# Patient Record
Sex: Female | Born: 2000 | Hispanic: Yes | Marital: Single | Smoking: Never smoker
Health system: Western US, Academic
[De-identification: ages and names within clinical notes are randomized; demographics above are authoritative.]

## PROBLEM LIST (undated history)

## (undated) DIAGNOSIS — E669 Obesity, unspecified: Secondary | ICD-10-CM

## (undated) DIAGNOSIS — E119 Type 2 diabetes mellitus without complications: Secondary | ICD-10-CM

## (undated) DIAGNOSIS — L709 Acne, unspecified: Secondary | ICD-10-CM

## (undated) DIAGNOSIS — L732 Hidradenitis suppurativa: Secondary | ICD-10-CM

## (undated) HISTORY — DX: Acne, unspecified: L70.9

## (undated) HISTORY — PX: MOUTH SURGERY: SHX715

## (undated) HISTORY — DX: Obesity, unspecified: E66.9

## (undated) HISTORY — PX: INCISION AND DRAINAGE, ABSCESS, AXILLA: SHX003694

---
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
## 2021-01-19 ENCOUNTER — Other Ambulatory Visit (INDEPENDENT_AMBULATORY_CARE_PROVIDER_SITE_OTHER): Payer: Self-pay | Admitting: Pediatric Endocrinology

## 2021-01-19 DIAGNOSIS — E669 Obesity, unspecified: Secondary | ICD-10-CM

## 2021-02-18 ENCOUNTER — Telehealth (INDEPENDENT_AMBULATORY_CARE_PROVIDER_SITE_OTHER): Payer: Self-pay | Admitting: "Endocrinology

## 2021-02-18 NOTE — Telephone Encounter (Signed)
Holly [name] called. She will finish her last Dexcom G6 sensor tonight. She called [facility], but they don't have any and don't expect any soon. Khiley was told that the company is not making them for some reason.  Since she does not have any test strips, I told her I will call in a prescription for 200 Accu-Chek Guide test strips. I will also ask our nurses to look into this issue more.  I called [facility] at [phone]. The pharmacist on duty confirmed that they do not have any G6 sensors on the shelf. He does not know when more may come in. I gave him a verbal order for 200 test strips with 5 refills.  Molli Knock, MD, CDE

## 2021-02-19 ENCOUNTER — Encounter (INDEPENDENT_AMBULATORY_CARE_PROVIDER_SITE_OTHER): Payer: Self-pay | Admitting: Pediatric Endocrinology

## 2021-02-19 ENCOUNTER — Telehealth (INDEPENDENT_AMBULATORY_CARE_PROVIDER_SITE_OTHER): Payer: Self-pay

## 2021-02-19 NOTE — Telephone Encounter (Signed)
PA for [facility] Receiver has been submitted on covermymeds. Waiting on approval/denial from insurance.

## 2021-02-24 NOTE — Telephone Encounter (Signed)
PA approved until 02/19/2022 

## 2021-03-05 ENCOUNTER — Other Ambulatory Visit: Payer: Self-pay

## 2021-03-05 ENCOUNTER — Ambulatory Visit (INDEPENDENT_AMBULATORY_CARE_PROVIDER_SITE_OTHER): Payer: Medicaid Other | Admitting: Pediatric Endocrinology

## 2021-03-05 ENCOUNTER — Encounter (INDEPENDENT_AMBULATORY_CARE_PROVIDER_SITE_OTHER): Payer: Self-pay | Admitting: Pediatric Endocrinology

## 2021-03-05 VITALS — BP 120/70 | HR 84 | Wt 172.4 lb

## 2021-03-05 DIAGNOSIS — E119 Type 2 diabetes mellitus without complications: Secondary | ICD-10-CM | POA: Diagnosis not present

## 2021-03-05 DIAGNOSIS — Z794 Long term (current) use of insulin: Secondary | ICD-10-CM

## 2021-03-05 DIAGNOSIS — L732 Hidradenitis suppurativa: Secondary | ICD-10-CM

## 2021-03-05 LAB — POCT GLYCOSYLATED HEMOGLOBIN (HGB A1C): Hemoglobin A1C: 8.1 % — AB (ref 4.0–5.6)

## 2021-03-05 LAB — POCT GLUCOSE (DEVICE FOR HOME USE): POC Glucose: 276 mg/dl — AB (ref 70–99)

## 2021-03-05 MED ORDER — MOUNJARO 2.5 MG/0.5ML ~~LOC~~ SOAJ: 2.5000 mg | SUBCUTANEOUS | 2 refills | Status: DC

## 2021-03-05 NOTE — Patient Instructions (Signed)
  Increase Tresiba to 44 units Decrease sweet tea intake (and other sweet drinks)  If you see that your sugars are starting to be <110- decrease your [place]. Call or MyChart if questions. Do not use MyChart on the weekends because we are not able to access the inbox outside of business hours.   We will try to get [facility] approved for you. I think that this would be optimal. If we are unable to get [facility] approved we may need to reintroduce Novolog.   Increasing exercise and decreasing simple carb intake (like sweet tea) may help [place] avoid Novolog.

## 2021-03-05 NOTE — Progress Notes (Signed)
Subjective:  Subjective  Patient Name: Holly [name] Date of Birth: 04/12/2001  MRN: [id number]  Holly [name]  presents to the office today for follow up evaluation and management of her  diabetes  HISTORY OF PRESENT ILLNESS:   Holly [name] is a 20 y.o. Hispanic female   Holly [name] was unaccompanied  1. Holly [name] was seen in pediatric clinic in June 2018 for her 16 year WCC. At that visit they obtained labs which revealed a hemoglobin a1c of 12%. Her PCP called her to go to the ER for evaluation but family was in [place] for vacation. She went to [facility] and had her initial evaluation there. She was antibody negative for GAD, Islet Cell and Insulin antibodies.  She was started on Lantus with sliding scale only Novolog. She was instructed to limit carbs to 60 grams per meal.  She transferred care to our clinic in July 2018 after returning from [place].   2. Holly [name] was last seen in pediatric endocrine clinic on 12/03/20 . In the interim she has been generally healthy.   She had Covid in early August. She was taking regular cough syrup (Nyquil) which was raising her sugars. She did not contact the office to let us know. She got over her Covid but her sensor was still running really high. When she calibrated her sensor there was a difference of 70 points from her meter.   She says that she has been consistent with taking her Trulicty and her [place]. However, she has been forgetting her Metformin. She has been eating out more often. When she eats out she will drink a sweet tea or a water.   She is taking   Tresiba 40 units Trulicity 4.5 mg. Dose on Friday.   She is missing her 750 mg of Metformin per day.   She does not feel that she is more hungry.   She has her cosmetology license now. 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     3. Pertinent Review of Systems:  Constitutional: The patient feels "good". The patient seems healthy and active.  Eyes:  Vision seems to be good. There are no recognized eye problems.  Neck: The patient has no complaints of anterior neck swelling, soreness, tenderness, pressure, discomfort, or difficulty swallowing.   Heart: Heart rate increases with exercise or other physical activity. The patient has no complaints of palpitations, irregular heart beats, chest pain, or chest pressure.   Lungs: no asthma or wheezing Gastrointestinal: Bowel movents seem normal. The patient has no complaints of excessive hunger, acid reflux, upset stomach, stomach aches or pains, diarrhea, or constipation.  Legs: Muscle mass and strength seem normal. There are no complaints of numbness, tingling, burning, or pain. No edema is noted.  Feet: There are no obvious foot problems. There are no complaints of numbness, tingling, burning, or pain. No edema is noted. Neurologic: There are no recognized problems with muscle movement and strength, sensation, or coordination. GYN/GU:  LMP 9/7 Skin: mild acne. Stretch marks. Still having issues with hydradenitis Suppurativa- no currently active sores  Diabetes ID: None- does have it on her phone   Injection sites: stomach.   Annual Labs - Done March 2022    Dexcom CGM:                 PAST MEDICAL, FAMILY, AND SOCIAL HISTORY  Past Medical History:  Diagnosis Date   Diabetes mellitus without complication (HCC)    Mild acne 12/04/2013   Obesity, unspecified 12/04/2013  Family History  Problem Relation Age of Onset   Diabetes Mother    Mom and maternal uncles with type 2 diabetes.   Current Outpatient Medications:    ACCU-CHEK FASTCLIX LANCETS MISC, Check sugar 6 x daily, Disp: 204 each, Rfl: 5   BD PEN NEEDLE NANO 2ND GEN 32G X 4 MM MISC, USE TO INJECT INSULIN 6 TIMES DAILY, Disp: 200 each, Rfl: 5   Clindamycin-Benzoyl Per, Refr, gel, Apply 1 application topically 2 (two) times daily., Disp: 45 g, Rfl: 1   Continuous Blood Gluc Sensor (DEXCOM G6 SENSOR) MISC, 1 each by Does  not apply route as directed. 1 sensor every 10 days, Disp: 3 each, Rfl: 11   Continuous Blood Gluc Transmit (DEXCOM G6 TRANSMITTER) MISC, 1 each by Does not apply route every 3 (three) months., Disp: 1 each, Rfl: 3   Dulaglutide (TRULICITY) 4.5 MG/0.5ML SOPN, Inject 4.5 mg as directed once a week., Disp: 2 mL, Rfl: 6   glucose blood (ACCU-CHEK GUIDE) test strip, USE TO CHECK SUGAR LEVELS SIX TIMES DAILY, Disp: 200 strip, Rfl: 5   insulin degludec (TRESIBA FLEXTOUCH) 200 UNIT/ML FlexTouch Pen, Up to 120 units per day as directed by physician, Disp: 30 mL, Rfl: 11   metFORMIN (GLUCOPHAGE-XR) 750 MG 24 hr tablet, Take 1 tablet (750 mg total) by mouth in the morning and at bedtime., Disp: 60 tablet, Rfl: 5   Ascorbic Acid (VITAMIN C ADULT GUMMIES PO), Take by mouth. (Patient not taking: No sig reported), Disp: , Rfl:    Continuous Blood Gluc Receiver (DEXCOM G6 RECEIVER) DEVI, 1 each by Does not apply route as directed. (Patient not taking: No sig reported), Disp: 1 each, Rfl: 1   dimenhyDRINATE (DRAMAMINE) 50 MG tablet, Take by mouth every 8 (eight) hours as needed., Disp: , Rfl:    Glucagon (BAQSIMI TWO PACK) 3 MG/DOSE POWD, Place 1 each into the nose as needed (severe hypoglycmia with unresponsiveness). (Patient not taking: No sig reported), Disp: 1 each, Rfl: 3   hydrOXYzine (ATARAX/VISTARIL) 10 MG tablet, Take 1 tablet (10 mg total) by mouth 3 (three) times daily as needed. (Patient not taking: No sig reported), Disp: 60 tablet, Rfl: 2   meclizine (ANTIVERT) 25 MG tablet, Take 1 tablet (25 mg total) by mouth 3 (three) times daily as needed for dizziness. (Patient not taking: No sig reported), Disp: 30 tablet, Rfl: 0   NOVOLOG FLEXPEN 100 UNIT/ML FlexPen, INJECT UP TO 100 UNITS UNDER THE SKIN DAILY PER DIABETES CARE PLAN (Patient not taking: No sig reported), Disp: 30 mL, Rfl: 5   ondansetron (ZOFRAN-ODT) 8 MG disintegrating tablet, Take 1 tablet (8 mg total) by mouth every 8 (eight) hours as needed for  nausea or vomiting. (Patient not taking: No sig reported), Disp: 20 tablet, Rfl: 0  Allergies as of 03/05/2021   (No Known Allergies)     reports that she has never smoked. She has never used smokeless tobacco. She reports that she does not drink alcohol and does not use drugs. Pediatric History  Patient Parents   Cervantes,Vitalina (Mother)   Cabrera,Arturo (Father)   Other Topics Concern   Not on file  Social History Narrative   Graduated from [employer]. Currently working at [employer].     1. School and Family:  Lives with parents and sister. Eye lashes and nails. Working from house 2. Activities: She is not currently exercising outside of work. [employer] quality scientist store). Also working at [employer].  3. Primary Care Provider: Luna Fuse,  Aron Baba, MD  ROS: There are no other significant problems involving Kateleen's other body systems.    Objective:  Objective   Vital Signs:    BP 120/70   Pulse 84   Wt 172 lb 6 oz (78.2 kg)   BMI 30.53 kg/m   Blood pressure percentiles are not available for patients who are 18 years or older.  Ht Readings from Last 3 Encounters:  05/29/20 5\' 3"  (1.6 m) (31 %, Z= -0.49)*  04/08/20 5\' 2"  (1.575 m) (19 %, Z= -0.88)*  01/24/20 5' 2.28" (1.582 m) (22 %, Z= -0.77)*   * Growth percentiles are based on CDC (Girls, 2-20 Years) data.   Wt Readings from Last 3 Encounters:  03/05/21 172 lb 6 oz (78.2 kg) (93 %, Z= 1.46)*  12/03/20 174 lb 9.6 oz (79.2 kg) (94 %, Z= 1.52)*  09/05/20 177 lb 9.6 oz (80.6 kg) (94 %, Z= 1.60)*   * Growth percentiles are based on CDC (Girls, 2-20 Years) data.   HC Readings from Last 3 Encounters:  No data found for [facility]   Body surface area is 1.86 meters squared.  No height on file for this encounter. 93 %ile (Z= 1.46) based on CDC (Girls, 2-20 Years) weight-for-age data using vitals from 03/05/2021. Constitutional: The patient appears healthy and well nourished. The patient's height and weight are normal for  age. She has lost 2 pounds since last visit  Head: The head is normocephalic. Face: The face appears normal. There are no obvious dysmorphic features. Eyes: The eyes appear to be normally formed and spaced. Gaze is conjugate. There is no obvious arcus or proptosis. Moisture appears normal. Ears: The ears are normally placed and appear externally normal. Mouth: The oropharynx and tongue appear normal. Dentition appears to be normal for age. Oral moisture is normal. Neck: The neck appears to be visibly normal. The consistency of the thyroid gland is normal. The thyroid gland is not tender to palpation. Lungs: No increased work of breathing. CTA.  Heart: Normal pulses and peripheral perfusion. RRR S1S2 Abdomen: The abdomen appears to be normal in size for the patient's age. . There is no obvious hepatomegaly, splenomegaly, or other mass effect.  Arms: Muscle size and bulk are normal for age. Hands: There is no obvious tremor. Phalangeal and metacarpophalangeal joints are normal. Palmar muscles are normal for age. Palmar skin is normal. Palmar moisture is also normal. Legs: Muscles appear normal for age. No edema is present. Feet: Feet are normally formed. Dorsalis pedal pulses are normal. Neurologic: Strength is normal for age in both the upper and lower extremities. Muscle tone is normal. Sensation to touch is normal in both the legs and feet.   GYN/GU: normal female  Skin: moderate facial acne. No acanthosis noted. Has green discoloration around neck from necklace  LAB DATA:    Lab Results  Component Value Date   HGBA1C 8.1 (A) 03/05/2021   HGBA1C 7.9 (A) 12/03/2020   HGBA1C 8.8 (H) 09/02/2020   HGBA1C 8.2 (A) 05/29/2020   HGBA1C 8.5 (A) 01/24/2020   HGBA1C 8.3 (A) 10/18/2019   HGBA1C 8.6 (A) 07/10/2019   HGBA1C 7.6 (A) 04/09/2019     Results for orders placed or performed in visit on 03/05/21  POCT glycosylated hemoglobin (Hb A1C)  Result Value Ref Range   Hemoglobin A1C 8.1 (A)  4.0 - 5.6 %   HbA1c POC (<> result, manual entry)     HbA1c, POC (prediabetic range)     HbA1c, POC (  controlled diabetic range)    POCT Glucose (Device for Home Use)  Result Value Ref Range   Glucose Fasting, POC     POC Glucose 276 (A) 70 - 99 mg/dl       Assessment and Plan:  Assessment  ASSESSMENT: Holly [name] is a 20 y.o. 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 [facility] and was antibody negative based on records.   Diabetes    - POC CBG as above - A1C as above- Still above ADA goal of <6.5% - Still taking [place]iba but no longer on Novolog - Tresiba 40 units.  - Now taking Trulicity 4.5 mg  - Continue blood sugar monitoring  - Dexcom CGM   Hydradenitis Suppurativa - no current lesions - Using Duac ointment PRN with good results.   PLAN:     1. Diagnostic: BG and A1C as above.  2. Therapeutic: Medications as above. Increase Tresiba to 44 units. Will try to get Mounjara 2.5 mg approved.   3. Patient education:  All discussion as above.  4. Follow-up: No follow-ups on file.      Dessa PhiJennifer Denajah Farias, MD  Level of Service: >40 minutes spent today reviewing the medical chart, counseling the patient/family, and documenting today's encounter.  When a patient is on insulin, intensive monitoring of blood glucose levels is necessary to avoid hyperglycemia and hypoglycemia. Severe hyperglycemia/hypoglycemia can lead to hospital admissions and be life threatening.    Patient referred by Ettefagh, Aron BabaKate Scott, MD for IDDM   Copy of this note sent to Ettefagh, Aron BabaKate Scott, MD

## 2021-03-06 ENCOUNTER — Telehealth (INDEPENDENT_AMBULATORY_CARE_PROVIDER_SITE_OTHER): Payer: Self-pay | Admitting: Pharmacist

## 2021-03-06 NOTE — Telephone Encounter (Signed)
Submitted Mounjaro prior authorization on 03/06/21.     Thank you for involving clinical pharmacist/diabetes educator to assist in providing this patient's care.   Zachery Conch, PharmD, BCACP, CDCES, CPP

## 2021-03-06 NOTE — Telephone Encounter (Addendum)
Checked covermymeds on 03/06/21  Mounjaro PA approved from 03/06/21 - 03/06/22    Called patient on 03/06/2021 at 11:53 AM and left HIPAA-compliant VM with instructions to call [facility] back.  Plan to provide update and schedule f/u to assist with titrating Mounjaro dose.   Thank you for involving pharmacy/diabetes educator to assist in providing this patient's care.   Zachery Conch, PharmD, BCACP, CDCES, CPP

## 2021-03-09 NOTE — Telephone Encounter (Signed)
Contacted patient   She received my VM, but unfortunately has not been able to obtain the medication from the pharmacy.   Will contact the pharmacy to determine what is going on and then send [place] an update via MyChart. It is possible pharmacy may need NDC code to order product [facility] [phone]).  Scheduled sugar call for 03/17/21 9:30 am.  Thank you for involving clinical pharmacist/diabetes educator to assist in providing this patient's care.   Zachery Conch, PharmD, BCACP, CDCES, CPP

## 2021-03-16 NOTE — Progress Notes (Signed)
This is a Pediatric Specialist virtual follow up consult provided via telephone. Holly [name] consented to an telephone visit consult today.  Location of patient: Holly [name] is at home. Location of provider: Zachery Conch, PharmD, BCACP, CDCES, CPP is at office.   I connected with Holly [name] on 03/17/21 by telephone and verified that I am speaking with the correct person using two identifiers. She has not had any upset stomach since starting Mounjaro. She feels her BG readings have been higher since starting Mounjaro. She also feels she is getting s/sx of a yeast infection due to her BG readings being elevated.   DM Medications  Biguanide: Metformin XR 750 mg once daily  Basal insulin: Tresiba 40 units once daily  GLP-1 agonist: Mounajro 2.5 mg subQ once weekly (first dose 03/13/21; Fridays)  Dexcom G6 CGM Report    Assessment TIR is not at goal > 70%. No hypoglycemia. TIR has improved since starting Mounjaro last Friday however BG have increased from 47% very high --> 60% very high. Will plan to add Novolog 6 units to her largest meal (dinner at 9:30 pm) and will increase metformin XR 750 mg once daily. Patient also would like to try a faster titration with Mounjaro 2.5 mg subQ once weekly --> 5 mg subQ once weekly after 2 weeks rather than 4 weeks considering she was previously on Trulicity 4.5 mg subQ once weekly. Will plan to do so. Since pt is experiencing s/sx of yeast infection will send in prescription for fluconazole 150 mg x 1 dose. Continue wearing Dexcom G6 CGM. Follow up 04/03/2021 (patient knows to contact me sooner if she starts experiencing hypoglycemia)  Plan Increase metformin XR 750 mg once daily --> twice daily  Continue Tresiba 40 units once daily Inititate Novolog 6 units at dinner  Increase Mounjaro 2.5 mg subQ once weekly (first dose 03/13/21; Fridays) --> Mounjaro 5 mg subQ once weekly  Initiate fluconazole 150 mg x 1 dose.   Continue Dexcom G6 CGM Follow up: 04/03/2021 (patient knows to contact me sooner if she starts experiencing hypoglycemia)  This appointment required 25 minutes of patient care (this includes precharting, chart review, review of results, virtual care, etc.).  Time spent since 02/26/21 - 03/27/21: 25 minutes  -03/17/21: 25 minutes   Thank you for involving clinical pharmacist/diabetes educator to assist in providing this patient's care.   Zachery Conch, PharmD, BCACP, CDCES, CPP

## 2021-03-17 ENCOUNTER — Ambulatory Visit (INDEPENDENT_AMBULATORY_CARE_PROVIDER_SITE_OTHER): Payer: Medicaid Other | Admitting: Pharmacist

## 2021-03-17 ENCOUNTER — Other Ambulatory Visit: Payer: Self-pay

## 2021-03-17 DIAGNOSIS — Z794 Long term (current) use of insulin: Secondary | ICD-10-CM

## 2021-03-17 DIAGNOSIS — E119 Type 2 diabetes mellitus without complications: Secondary | ICD-10-CM

## 2021-03-17 MED ORDER — MOUNJARO 5 MG/0.5ML ~~LOC~~ SOAJ: 5.0000 mg | SUBCUTANEOUS | 2 refills | Status: DC

## 2021-03-17 MED ORDER — METFORMIN HCL ER 750 MG PO TB24: 750.0000 mg | ORAL_TABLET | Freq: Two times a day (BID) | ORAL | 5 refills | Status: DC

## 2021-03-17 MED ORDER — FLUCONAZOLE 150 MG PO TABS: 150.0000 mg | ORAL_TABLET | Freq: Every day | ORAL | 0 refills | Status: DC

## 2021-04-03 ENCOUNTER — Ambulatory Visit (INDEPENDENT_AMBULATORY_CARE_PROVIDER_SITE_OTHER): Payer: Medicaid Other | Admitting: Pharmacist

## 2021-04-07 ENCOUNTER — Other Ambulatory Visit: Payer: Self-pay

## 2021-04-07 ENCOUNTER — Ambulatory Visit (INDEPENDENT_AMBULATORY_CARE_PROVIDER_SITE_OTHER): Payer: Medicaid Other | Admitting: Pharmacist

## 2021-04-07 DIAGNOSIS — E119 Type 2 diabetes mellitus without complications: Secondary | ICD-10-CM

## 2021-04-07 DIAGNOSIS — Z794 Long term (current) use of insulin: Secondary | ICD-10-CM

## 2021-04-07 NOTE — Progress Notes (Signed)
This is a Pediatric Specialist virtual follow up consult provided via telephone. Holly [name] consented to an telephone visit consult today.  Location of patient: Holly [name] is at home. Location of provider: Zachery Conch, PharmD, BCACP, CDCES, CPP is at office.   I connected with Holly [name] on 04/07/21 by telephone and verified that I am speaking with the correct person using two identifiers. [name] reports she has not increased to 5 mg subQ once weekly yet - still on 2.5 mg subQ once weekly. She reports that she feels nauseous and "gassy". She wants to continue Mounjaro 2.5 mg for x 2 weeks then increase to 5 mg. She reports forgetting to do second dose of metformin. She reports adherence [place]. She takes Novolog 6 units about 3x/week. Reports yeast infection improved after fluconazole dose, however, she still has itching (she will take Azo (phenazopyridine)).  DM Medications  Biguanide: Metformin XR 750 mg once daily  Basal insulin: Tresiba 40 units once daily  Bolus insulin: Novolog 6 units at dinner GLP-1 agonist: Mounjaro 2.5 mg subQ once weekly (first dose 03/13/21; Fridays)  Dexcom G6 CGM Report    Assessment Diabetes Management - TIR is not at goal > 70%. No hypoglycemia. TIR has improved slightly. Patient was experiencing GI upset with Mounjaro 2.5 mg so did not increase to 5 mg. Discussed with her to eat smaller portions and explained MOA leads to GI side effects. Offered slower titration schedule to patient (wait an additional month prior to increasing dose from 2.5 mg --> 5 mg); patient would prefer to titrate Mounjaro dose in 2 weeks. She will keep in consideration to eat smaller meals. She is lacking adherence to BID dosing of metformin; will plan to just take metformin once daily. She is taking Novolog ~3x/week; advise her to take daily at dinner. Continue Tresiba 40 units daily Continue wearing Dexcom G6 CGM. Follow up 04/28/2021 (patient  knows to contact me sooner if she starts experiencing hypoglycemia)  Vaginal irritation/itching - Reports yeast infection improved after fluconazole 150 mg x 1 dose, however, she still has itching. She told me she will take Azo (phenazopyridine)). Advised her to f/u with PCP if s/sx worsens.    Plan Decrease metformin XR 750 mg twice daily --> once daily Continue Tresiba 40 units once daily Continue Novolog 6 units at dinner (increase frequency from 3x/week --> daily) Increase Mounjaro 2.5 mg subQ once weekly (first dose 03/13/21; Fridays) --> Mounjaro 5 mg subQ once weekly on 04/24/21 Continue Dexcom G6 CGM Follow up: 04/28/2021 (patient knows to contact me sooner if she starts experiencing hypoglycemia)  This appointment required 24 minutes of patient care (this includes precharting, chart review, review of results, virtual care, etc.).  Time spent since 03/28/21 - 04/27/21: 24 minutes  -04/07/21: 24 minutes   Thank you for involving clinical pharmacist/diabetes educator to assist in providing this patient's care.   Zachery Conch, PharmD, BCACP, CDCES, CPP

## 2021-04-13 ENCOUNTER — Encounter (INDEPENDENT_AMBULATORY_CARE_PROVIDER_SITE_OTHER): Payer: Self-pay

## 2021-04-15 ENCOUNTER — Encounter (INDEPENDENT_AMBULATORY_CARE_PROVIDER_SITE_OTHER): Payer: Self-pay

## 2021-04-27 NOTE — Progress Notes (Deleted)
This is a Pediatric Specialist virtual follow up consult provided via telephone. Holly [name] consented to an telephone visit consult today.  Location of patient: Holly [name] is at home. Location of provider: Zachery Conch, PharmD, BCACP, CDCES, CPP is at office.   I connected with Holly [name] on 04/28/21 by telephone and verified that I am speaking with the correct person using two identifiers. ***  DM Medications  Biguanide: Metformin XR 750 mg once daily  Basal insulin: Tresiba 40 units once daily  Bolus insulin: Novolog 6 units at dinner - TAKING EVERY NIGHT GLP-1 agonist: Mounjaro 2.5 mg subQ once weekly (first dose 03/13/21; Fridays) *** ON 5 MG??????  Dexcom G6 CGM Report  Assessment Diabetes Management - TIR is not at goal > 70%. No hypoglycemia. *** Follow up ***  Plan *** metformin XR 750 mg once daily *** Tresiba 40 units once daily *** Novolog 6 units at dinner *** Mounjaro 5 mg subQ once weekly on 04/24/21 *** Dexcom G6 CGM Follow up: ***  This appointment required *** minutes of patient care (this includes precharting, chart review, review of results, virtual care, etc.).  Time spent since 04/28/21 - 04/26/21: *** minutes  -04/28/21: *** minutes  Thank you for involving clinical pharmacist/diabetes educator to assist in providing this patient's care.   Zachery Conch, PharmD, BCACP, CDCES, CPP

## 2021-04-28 ENCOUNTER — Ambulatory Visit (INDEPENDENT_AMBULATORY_CARE_PROVIDER_SITE_OTHER): Payer: Medicaid Other | Admitting: Pharmacist

## 2021-05-27 ENCOUNTER — Other Ambulatory Visit (INDEPENDENT_AMBULATORY_CARE_PROVIDER_SITE_OTHER): Payer: Self-pay | Admitting: Pediatric Endocrinology

## 2021-05-27 ENCOUNTER — Encounter (INDEPENDENT_AMBULATORY_CARE_PROVIDER_SITE_OTHER): Payer: Self-pay | Admitting: Pharmacist

## 2021-05-27 DIAGNOSIS — Z794 Long term (current) use of insulin: Secondary | ICD-10-CM

## 2021-05-27 DIAGNOSIS — E119 Type 2 diabetes mellitus without complications: Secondary | ICD-10-CM

## 2021-05-27 MED ORDER — FLUCONAZOLE 150 MG PO TABS: 150.0000 mg | ORAL_TABLET | Freq: Every day | ORAL | 0 refills | Status: DC

## 2021-06-04 ENCOUNTER — Ambulatory Visit (INDEPENDENT_AMBULATORY_CARE_PROVIDER_SITE_OTHER): Payer: Medicaid Other | Admitting: Pediatric Endocrinology

## 2021-06-04 ENCOUNTER — Other Ambulatory Visit: Payer: Self-pay

## 2021-06-04 ENCOUNTER — Encounter (INDEPENDENT_AMBULATORY_CARE_PROVIDER_SITE_OTHER): Payer: Self-pay | Admitting: Pharmacist

## 2021-06-04 ENCOUNTER — Encounter (INDEPENDENT_AMBULATORY_CARE_PROVIDER_SITE_OTHER): Payer: Self-pay | Admitting: Pediatric Endocrinology

## 2021-06-04 VITALS — BP 110/76 | HR 80 | Wt 164.6 lb

## 2021-06-04 DIAGNOSIS — Z794 Long term (current) use of insulin: Secondary | ICD-10-CM

## 2021-06-04 DIAGNOSIS — E1165 Type 2 diabetes mellitus with hyperglycemia: Secondary | ICD-10-CM

## 2021-06-04 DIAGNOSIS — E119 Type 2 diabetes mellitus without complications: Secondary | ICD-10-CM

## 2021-06-04 LAB — POCT GLUCOSE (DEVICE FOR HOME USE): Glucose Fasting, POC: 154 mg/dL — AB (ref 70–99)

## 2021-06-04 LAB — POCT GLYCOSYLATED HEMOGLOBIN (HGB A1C): Hemoglobin A1C: 8.5 % — AB (ref 4.0–5.6)

## 2021-06-04 MED ORDER — FLUCONAZOLE 150 MG PO TABS: 150.0000 mg | ORAL_TABLET | ORAL | 0 refills | Status: DC | PRN

## 2021-06-04 MED ORDER — MOUNJARO 7.5 MG/0.5ML ~~LOC~~ SOAJ: 7.5000 mg | SUBCUTANEOUS | 5 refills | Status: DC

## 2021-06-04 NOTE — Progress Notes (Signed)
Subjective:  Subjective  Patient Name: Holly [name] Date of Birth: 2000/12/13  MRN: [id number]  Holly [name]  presents to the office today for follow up evaluation and management of her  diabetes  HISTORY OF PRESENT ILLNESS:   Holly [name] is a 20 y.o. Hispanic female   Holly [name] was unaccompanied  1. Holly [name] was seen in pediatric clinic in June 2018 for her 16 year [place]. At that visit they obtained labs which revealed a hemoglobin a1c of 12%. Her PCP called her to go to the ER for evaluation but family was in [place] for vacation. She went to [facility] and had her initial evaluation there. She was antibody negative for GAD, Islet Cell and Insulin antibodies.  She was started on Lantus with sliding scale only Novolog. She was instructed to limit carbs to 60 grams per meal.  She transferred care to our clinic in July 2018 after returning from [place].   2. Holly [name] was last seen in pediatric endocrine clinic on 03/05/21 . In the interim she has been generally healthy.   She has been feeling more tired. She thinks that it may be related to starting Mounjaro instead of Ozempic. She has been sleeping more than usual.   She is going to bed at 11-12 pm. She gets up between 8-noon depending on her work schedule. She sometimes naps after work.   She also had Covid in August which would correspond with about the time she started on [facility].  She also had Flu at Thanksgiving.   Overall she has not had any issues with the [facility] except that she has not seen as much improvement in her sugars as she was hoping. She has not had any abdominal side effects.   She thinks that she is eating ok. However, her mom thinks she is too skinny and her sister asked her if she is eating.   She has not had breakfast yet today.  Dinner: She had a cheeseburger and 10 nuggets from [employer] for dinner last night. She drank water Snack: Dried mango with chili powder-  snack Lunch: sandwich from [employer] Breakfast: donut.    She is taking   Tresiba 44 units Mounjaro Dose on Sundays   She has her cosmetology license now. 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 She is also working at [employer] (walking around the store! She does online shopping)   3. Pertinent Review of Systems:  Constitutional: The patient feels "good". The patient seems healthy and active.  Eyes: Vision seems to be good. There are no recognized eye problems.  Neck: The patient has no complaints of anterior neck swelling, soreness, tenderness, pressure, discomfort, or difficulty swallowing.   Heart: Heart rate increases with exercise or other physical activity. The patient has no complaints of palpitations, irregular heart beats, chest pain, or chest pressure.   Lungs: no asthma or wheezing Gastrointestinal: Bowel movents seem normal. The patient has no complaints of excessive hunger, acid reflux, upset stomach, stomach aches or pains, diarrhea, or constipation.  Legs: Muscle mass and strength seem normal. There are no complaints of numbness, tingling, burning, or pain. No edema is noted.  Feet: There are no obvious foot problems. There are no complaints of numbness, tingling, burning, or pain. No edema is noted. Neurologic: There are no recognized problems with muscle movement and strength, sensation, or coordination. GYN/GU:  LMP 11/27  Had a yeast infection  Skin: mild acne. Stretch marks.   Diabetes ID: None- does have it on her  phone   Injection sites: stomach.   Annual Labs - Done March 2022    Dexcom CGM:                       PAST MEDICAL, FAMILY, AND SOCIAL HISTORY  Past Medical History:  Diagnosis Date   Diabetes mellitus without complication ([place])    Mild acne 12/04/2013   Obesity, unspecified 12/04/2013    Family History  Problem Relation Age of Onset   Diabetes Mother    Mom and maternal uncles with type 2 diabetes.   Current Outpatient  Medications:    ACCU-CHEK FASTCLIX LANCETS MISC, Check sugar 6 x daily, Disp: 204 each, Rfl: 5   BD PEN NEEDLE NANO 2ND GEN 32G X 4 MM MISC, USE TO INJECT INSULIN 6 TIMES DAILY, Disp: 200 each, Rfl: 5   Clindamycin-Benzoyl Per, Refr, gel, Apply 1 application topically 2 (two) times daily., Disp: 45 g, Rfl: 1   Continuous Blood Gluc Receiver ([place]) DEVI, 1 each by Does not apply route as directed., Disp: 1 each, Rfl: 1   Continuous Blood Gluc Sensor (DEXCOM G6 SENSOR) MISC, 1 each by Does not apply route as directed. 1 sensor every 10 days, Disp: 3 each, Rfl: 11   Continuous Blood Gluc Transmit (DEXCOM G6 TRANSMITTER) MISC, 1 each by Does not apply route every 3 (three) months., Disp: 1 each, Rfl: 3   glucose blood (ACCU-CHEK GUIDE) test strip, USE TO CHECK SUGAR LEVELS SIX TIMES DAILY, Disp: 200 strip, Rfl: 5   insulin degludec (TRESIBA FLEXTOUCH) 200 UNIT/ML FlexTouch Pen, Up to 120 units per day as directed by physician, Disp: 30 mL, Rfl: 11   metFORMIN (GLUCOPHAGE-XR) 750 MG 24 hr tablet, Take 1 tablet (750 mg total) by mouth in the morning and at bedtime., Disp: 60 tablet, Rfl: 5   tirzepatide (MOUNJARO) 7.5 MG/0.5ML Pen, Inject 7.5 mg into the skin once a week., Disp: 2 mL, Rfl: 5   Ascorbic Acid (VITAMIN C ADULT GUMMIES PO), Take by mouth. (Patient not taking: Reported on 12/03/2020), Disp: , Rfl:    dimenhyDRINATE (DRAMAMINE) 50 MG tablet, Take by mouth every 8 (eight) hours as needed. (Patient not taking: Reported on 06/04/2021), Disp: , Rfl:    fluconazole (DIFLUCAN) 150 MG tablet, Take 1 tablet (150 mg total) by mouth as needed., Disp: 5 tablet, Rfl: 0   Glucagon (BAQSIMI TWO PACK) 3 MG/DOSE POWD, Place 1 each into the nose as needed (severe hypoglycmia with unresponsiveness). (Patient not taking: Reported on 01/24/2020), Disp: 1 each, Rfl: 3   hydrOXYzine (ATARAX/VISTARIL) 10 MG tablet, Take 1 tablet (10 mg total) by mouth 3 (three) times daily as needed. (Patient not taking:  Reported on 09/05/2020), Disp: 60 tablet, Rfl: 2   meclizine (ANTIVERT) 25 MG tablet, Take 1 tablet (25 mg total) by mouth 3 (three) times daily as needed for dizziness. (Patient not taking: Reported on 05/29/2020), Disp: 30 tablet, Rfl: 0   NOVOLOG FLEXPEN 100 UNIT/ML FlexPen, INJECT UP TO 100 UNITS UNDER THE SKIN DAILY PER DIABETES CARE PLAN (Patient not taking: Reported on 12/03/2020), Disp: 30 mL, Rfl: 5   ondansetron (ZOFRAN-ODT) 8 MG disintegrating tablet, Take 1 tablet (8 mg total) by mouth every 8 (eight) hours as needed for nausea or vomiting. (Patient not taking: Reported on 12/03/2020), Disp: 20 tablet, Rfl: 0  Allergies as of 06/04/2021   (No Known Allergies)     reports that she has never smoked. She has never used  smokeless tobacco. She reports that she does not drink alcohol and does not use drugs. Pediatric History  Patient Parents   Cervantes,Vitalina (Mother)   Cabrera,Arturo (Father)   Other Topics Concern   Not on file  Social History Narrative   Graduated from [employer]. Currently working at [employer].     1. School and Family:  Lives with parents and sister. Eye lashes and nails. Working from house 2. Activities: She is not currently exercising outside of work. [employer] store).  3. Primary Care Provider: Carmie End, MD  ROS: There are no other significant problems involving Holly [name]'s other body systems.    Objective:  Objective   Vital Signs:    BP 110/76   Pulse 80   Wt 164 lb 9.6 oz (74.7 kg)   BMI 29.16 kg/m   Blood pressure percentiles are not available for patients who are 18 years or older.  Ht Readings from Last 3 Encounters:  05/29/20 5\' 3"  (1.6 m) (31 %, Z= -0.49)*  04/08/20 5\' 2"  (1.575 m) (19 %, Z= -0.88)*  01/24/20 5' 2.28" (1.582 m) (22 %, Z= -0.77)*   * Growth percentiles are based on CDC (Girls, 2-20 Years) data.   Wt Readings from Last 3 Encounters:  06/04/21 164 lb 9.6 oz (74.7 kg) (90 %, Z= 1.27)*  03/05/21 172 lb  6 oz (78.2 kg) (93 %, Z= 1.46)*  12/03/20 174 lb 9.6 oz (79.2 kg) (94 %, Z= 1.52)*   * Growth percentiles are based on CDC (Girls, 2-20 Years) data.   HC Readings from Last 3 Encounters:  No data found for [facility]   Body surface area is 1.82 meters squared.  No height on file for this encounter. 90 %ile (Z= 1.27) based on CDC (Girls, 2-20 Years) weight-for-age data using vitals from 06/04/2021. Constitutional: The patient appears healthy and well nourished. The patient's height and weight are normal for age. She has lost 8 pounds since last visit  Head: The head is normocephalic. Face: The face appears normal. There are no obvious dysmorphic features. Eyes: The eyes appear to be normally formed and spaced. Gaze is conjugate. There is no obvious arcus or proptosis. Moisture appears normal. Ears: The ears are normally placed and appear externally normal. Mouth: The oropharynx and tongue appear normal. Dentition appears to be normal for age. Oral moisture is normal. Neck: The neck appears to be visibly normal. The consistency of the thyroid gland is normal. The thyroid gland is not tender to palpation. Lungs: No increased work of breathing. CTA.  Heart: Normal pulses and peripheral perfusion. RRR S1S2 Abdomen: The abdomen appears to be normal in size for the patient's age. . There is no obvious hepatomegaly, splenomegaly, or other mass effect.  Arms: Muscle size and bulk are normal for age. Hands: There is no obvious tremor. Phalangeal and metacarpophalangeal joints are normal. Palmar muscles are normal for age. Palmar skin is normal. Palmar moisture is also normal. Legs: Muscles appear normal for age. No edema is present. Feet: Feet are normally formed. Dorsalis pedal pulses are normal. Neurologic: Strength is normal for age in both the upper and lower extremities. Muscle tone is normal. Sensation to touch is normal in both the legs and feet.   GYN/GU: normal female  Skin: moderate facial acne. No  acanthosis noted. Has green discoloration around neck from necklace  LAB DATA:    Lab Results  Component Value Date   HGBA1C 8.5 (A) 06/04/2021   HGBA1C 8.1 (A) 03/05/2021  HGBA1C 7.9 (A) 12/03/2020   HGBA1C 8.8 (H) 09/02/2020   HGBA1C 8.2 (A) 05/29/2020   HGBA1C 8.5 (A) 01/24/2020   HGBA1C 8.3 (A) 10/18/2019   HGBA1C 8.6 (A) 07/10/2019     Results for orders placed or performed in visit on 06/04/21  POCT glycosylated hemoglobin (Hb A1C)  Result Value Ref Range   Hemoglobin A1C 8.5 (A) 4.0 - 5.6 %   HbA1c POC (<> result, manual entry)     HbA1c, POC (prediabetic range)     HbA1c, POC (controlled diabetic range)    POCT Glucose (Device for Home Use)  Result Value Ref Range   Glucose Fasting, POC 154 (A) 70 - 99 mg/dL   POC Glucose         Assessment and Plan:  Assessment  ASSESSMENT: Holly [name] is a 20 y.o. 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 She was diagnosed at [facility] and was antibody negative based on records.    Diabetes    - POC CBG as above - A1C as above- Still above ADA goal of <6.5% - Still taking [place] but no longer on Novolog - Tresiba 44 units.  - Now taking Mounjaro 5 mg- will increase to 7.5 mg this week - Dexcom CGM Reviewed report in detail with patient and used this data to adjust Mounjaro dose.  - She is pleased with weight loss on Mounjaro with lack of abdominal pain and no sense of deprivation in what she is eating.   PLAN:     1. Diagnostic: Lab Orders         POCT glycosylated hemoglobin (Hb A1C)         POCT Glucose (Device for Home Use)     2. Therapeutic: Medications as above. Continue Tresiba 44 units. Increase Mounjaro to 7.5 mg.  3. Patient education:  All discussion as above.  4. Follow-up: Return in about 3 months (around 09/02/2021).    Sugar call with Dr. Ladona Ridgel in 1 month   Dessa Phi, MD  Level of Service: >40 minutes spent today reviewing the medical chart, counseling the patient/family,  and documenting today's encounter.   When a patient is on insulin, intensive monitoring of blood glucose levels is necessary to avoid hyperglycemia and hypoglycemia. Severe hyperglycemia/hypoglycemia can lead to hospital admissions and be life threatening.    Patient referred by Ettefagh, Aron Baba, MD for IDDM   Copy of this note sent to Ettefagh, Aron Baba, MD

## 2021-07-02 ENCOUNTER — Other Ambulatory Visit: Payer: Self-pay

## 2021-07-02 ENCOUNTER — Ambulatory Visit (INDEPENDENT_AMBULATORY_CARE_PROVIDER_SITE_OTHER): Payer: Medicaid Other | Admitting: Pharmacist

## 2021-07-02 DIAGNOSIS — E1165 Type 2 diabetes mellitus with hyperglycemia: Secondary | ICD-10-CM

## 2021-07-02 NOTE — Progress Notes (Signed)
This is a Pediatric Specialist virtual follow up consult provided via telephone. Holly [name] consented to an telephone visit consult today.  Location of patient: Holly [name]  is at home. Location of provider: Trixie Rude, PharmD is at office.   I connected with Lance Sell on 07/02/21 by telephone and verified that I am speaking with the correct person using two identifiers. She reports feeling her sugars have been ok recently. She has not been using her Dexcom recently due to administration site issues, but is willing to try wearing it more consistently after further discussion. She reports checking her sugars manually x3-4 times day. Recent highest of ~275 and lowest of ~150. She does not take Novolog regularly, just during recent holidays when not eating as she should. Reports tolerating increased dose of Mounjaro from 5 mg to 7.5 mg weekly.  DM medications Basal insulin: Tresiba 40 units once daily  GLP-1 agonist: Mounjaro 7.5 mg subQ once weekly (last increased 06/04/21; Sundays)  Dexcom G6 CGM Report   Assessment TIR is not at goal > 70%. No episodes of hypoglycemia. Finger-stick BG elevated 150-270s. Patient encouraged to wear Dexcom G6 CGM more consistently. Encouraged patient to remember to take [place] daily and plan to increase dose to 44 units as patient discussed with Dr. Vanessa  on 12/08. Plan to increase Mounjaro to 10 mg once weekly after patient completes 4 weeks of current dose. Follow-up in two weeks after starting new Mounjaro dose (07/21/21).  Plan Increase Tresiba to 44 units once daily Continue Mounjaro 7.5 mg subQ x1 dose, then increase to 10 mg SubQ once weekly starting 1/14 (takes doses Sundays) Continue Dexcom G6 CGM  Follow up: 07/21/21    This appointment required 25 minutes of patient care (this includes precharting, chart review, review of results, virtual care, etc.).  Time spent since 06/28/21 - 07/28/21: 25 minutes   -07/02/21: 25 minutes (billed no charge)  Trixie Rude, PharmD PGY2 Pediatric Pharmacy Resident  The pharmacy resident and I have discussed this patient's care and are in agreeance with the plan. I have reviewed the documentation as well. I was immediately available to the pharmacy resident for questions and collaboration.  Thank you for involving clinical pharmacist/diabetes educator to assist in providing this patient's care.   Zachery Conch, PharmD, BCACP, CDCES, CPP

## 2021-07-21 ENCOUNTER — Other Ambulatory Visit: Payer: Self-pay

## 2021-07-21 ENCOUNTER — Other Ambulatory Visit (INDEPENDENT_AMBULATORY_CARE_PROVIDER_SITE_OTHER): Payer: Self-pay | Admitting: Pharmacist

## 2021-07-21 ENCOUNTER — Ambulatory Visit (INDEPENDENT_AMBULATORY_CARE_PROVIDER_SITE_OTHER): Payer: Medicaid Other | Admitting: Pharmacist

## 2021-07-21 DIAGNOSIS — E1165 Type 2 diabetes mellitus with hyperglycemia: Secondary | ICD-10-CM

## 2021-07-21 MED ORDER — MOUNJARO 10 MG/0.5ML ~~LOC~~ SOAJ: 10.0000 mg | SUBCUTANEOUS | 2 refills | Status: DC

## 2021-07-21 NOTE — Progress Notes (Addendum)
This is a Pediatric Specialist virtual follow up consult provided via telephone. Holly [name] consented to an telephone visit consult today.  Location of patient: Holly [name]  is at home. Location of provider: Trixie Rude, PharmD is at office.   I connected with Holly [name] on 07/21/21 by telephone and verified that I am speaking with the correct person using two identifiers. She reports things have been going well with her sugars since our last call. She has been wearing her Dexcom more often without issue and is remembering to take her [place] daily. She has been tolerating her Mounjaro and is still taking 7.5 mg at this time. It appears a prescription for the 10 mg pens were not sent to pharmacy at last visit as intended. Dr. Ladona Ridgel discussed this with patient and new prescription for 10 mg pens has been sent for the next refill period.   DM medications Basal insulin: Tresiba 44 units once daily  GLP-1 agonist: Mounjaro 7.5 mg subQ once weekly (last increased 06/04/21; Mondays)  Dexcom G6 CGM Report   Assessment TIR is not at goal > 70%. No episodes of hypoglycemia. Patient encouraged to continue wearing Dexcom G6 CGM consistently. Continue taking Tresiba 44 units daily. Plan to increase Mounjaro to 10 mg once weekly after patient finishes remaining 3 doses of current 7.5 mg supply. Follow-up in three weeks (08/11/21) before starting new Mounjaro dose.  Plan Increase Tresiba to 44 units once daily Continue Mounjaro 7.5 mg subQ x3 more doses, then increase to 10 mg SubQ once weekly starting 2/20 (takes doses Monday) if still tolerating Continue Dexcom G6 CGM  Follow up: 08/11/21    This appointment required 15 minutes of patient care (this includes precharting, chart review, review of results, virtual care, etc.).  Time spent since 06/28/21 - 07/28/21: 40 minutes  -07/02/21: 25 minutes (billed no charge) -07/21/21: 15 minutes (billed no charge)  Trixie Rude, PharmD PGY2 Pediatric Pharmacy Resident  The pharmacy resident and I have discussed this patient's care and are in agreeance with the plan. I have reviewed the documentation as well. I was immediately available to the pharmacy resident for questions and collaboration.  Thank you for involving clinical pharmacist/diabetes educator to assist in providing this patient's care.   Zachery Conch, PharmD, BCACP, CDCES, CPP  I have reviewed the following documentation and I am in agreement with the plan. I was immediately available to the clinical pharmacist for questions and collaboration.  Dessa Phi, MD

## 2021-07-22 ENCOUNTER — Telehealth (INDEPENDENT_AMBULATORY_CARE_PROVIDER_SITE_OTHER): Payer: Self-pay

## 2021-07-22 NOTE — Telephone Encounter (Signed)
Fax received by [facility] stating that [facility] 10mg  is on back order at the moment.  Pt just picked up their 7.5mg s on Jul 19, 2021. In Dr Jul 21, 2021 notes from pt visit on 07/21/21, pt is not expected to start Mounjaro 10mg s:   "Plan to increase Mounjaro to 10 mg once weekly after patient finishes remaining 3 doses of current 7.5 mg supply. Follow-up in three weeks (08/11/21) before starting new Mounjaro dose."  I will inform family they need to call [facility] 5 days prior to needing the new Mounjaro to see if it is in stock.

## 2021-08-11 ENCOUNTER — Ambulatory Visit (INDEPENDENT_AMBULATORY_CARE_PROVIDER_SITE_OTHER): Payer: Medicaid Other | Admitting: Pharmacist

## 2021-08-11 ENCOUNTER — Other Ambulatory Visit: Payer: Self-pay

## 2021-08-11 DIAGNOSIS — E119 Type 2 diabetes mellitus without complications: Secondary | ICD-10-CM

## 2021-08-11 DIAGNOSIS — Z794 Long term (current) use of insulin: Secondary | ICD-10-CM

## 2021-08-11 NOTE — Progress Notes (Addendum)
This is a Pediatric Specialist virtual follow up consult provided via telephone. Holly [name] consented to an telephone visit consult today.  Location of patient: Holly [name]  is at home. Location of provider: Zachery Conch, PharmD, BCACP, CDCES, CPP is at office.   I connected with Holly [name] on 08/11/21 by telephone and verified that I am speaking with the correct person using two identifiers. Patient reports doing well on Mounjaro 7.5 mg subQ once weekly. She does report she has been constipated but thinks this may be related to not drinking enough water. She reports she self-increased Tresiba from 44 units daily to 46 units daily. She is ready to increase to Mounjaro 10 mg subQ once weekly.  DM medications Basal insulin: Tresiba 44 units once daily  GLP-1 agonist: Mounjaro 7.5 mg subQ once weekly (last increased 06/04/21; Mondays)  Dexcom G6 CGM Report   Assessment TIR is not at goal > 70%. No episodes of hypoglycemia. Will increase Mounjaro from 7.5 mg subQ once weekly to 10 mg subQ once weekly. Continue Tresiba 46 units daily. Discussed with her we have to stop increasing [place] as once she gets >0.5 units/kg/day there is a need to add bolus insulin as solely basal insulin is not efficacious for BG lowering. Patient did not like taking bolus insulin previously and forgot to take it often so we will focus on increasing Mounjaro for now. We may need to add SGLT2 inhibitor,  but considering her prior hx of yeast infections I am hesitant to do so until BG readings are lower. Provided her names of possible new medications - Farxiga, Jardiance to look up so we can make collaborative decision to start SGTL2 inhibitor. Follow-up in two weeks (08/25/21) to check in and see how she is doing on increased dose of Mounjaro 10 mg subQ once weekly and re-assess BG readings. Continue wearing Dexcom G6 CGM.   Plan Continue Tresiba 46  units once daily Increase Mounjaro  from 7.5 mg subQ once weekly to 10 mg subQ once weekly Continue Dexcom G6 CGM  Follow up: 08/25/21  This appointment required 8 minutes of patient care (this includes precharting, chart review, review of results, virtual care, etc.).  Time spent since 07/29/21 - 08/25/21: 8 minutes   Thank you for involving clinical pharmacist/diabetes educator to assist in providing this patient's care.   Zachery Conch, PharmD, BCACP, CDCES, CPP    I have reviewed the following documentation and I am in agreement with the plan. I was immediately available to the clinical pharmacist for questions and collaboration.  Dessa Phi, MD

## 2021-08-19 ENCOUNTER — Encounter (INDEPENDENT_AMBULATORY_CARE_PROVIDER_SITE_OTHER): Payer: Self-pay | Admitting: Pharmacist

## 2021-08-25 ENCOUNTER — Ambulatory Visit (INDEPENDENT_AMBULATORY_CARE_PROVIDER_SITE_OTHER): Payer: Medicaid Other | Admitting: Pharmacist

## 2021-08-25 DIAGNOSIS — Z794 Long term (current) use of insulin: Secondary | ICD-10-CM

## 2021-08-25 DIAGNOSIS — E119 Type 2 diabetes mellitus without complications: Secondary | ICD-10-CM

## 2021-08-25 NOTE — Progress Notes (Addendum)
This is a Pediatric Specialist virtual follow up consult provided via telephone. Holly [name] consented to an telephone visit consult today.  Location of patient: Holly [name]  is at home. Location of provider: Zachery Conch, PharmD, BCACP, CDCES, CPP is at office.   I connected with Holly [name] on 08/25/21 by telephone and verified that I am speaking with the correct person using two identifiers. She picked up Mounjaro 10 mg past Thursday. She reports feeling very "gassy". She denies eating fried/fast food/eating large portions. She does not feel she has much of an appetitte.    DM medications Basal insulin: Tresiba 46 units once daily  GLP-1 agonist: Mounjaro 10 mg subQ once weekly (last increased 08/20/21; Thursdays)  Dexcom G6 CGM Report   Assessment TIR is not at goal > 70%. No episodes of hypoglycemia. Patient was able to successfully obtain Mounjaro from the pharmacy and started last week. Patient having some GI effects - is managing appropriately (not eating fried/fast food, eating small portions). She states it is tolerable and she wants to stay on the Mounjaro 10 mg. I explained her tolerance to feeling increases in flatulence will improve the longer she is on the drug.If we need to we can always decrease down to Mounjaro 7.5 mg and increase insulin. We can also do a slower titration with Mounjaro (anticipated next dose increase is 09/17/21). Will increase Tresiba from 46 units once daily to 52 units once daily (0.69 units/kg; >0.5 units/kg but patient is likely going through puberty so may require TDD doses as high as 2.0 units/kg/day which would be 1.0 units/kg/day for 50% basal, 50% bolus; discussed overbasalization risk with pateient). Continue wearing Dexcom G6 CGM. Follow up 09/15/21.  Plan Increase Tresiba 46  units once daily --> Tresiba 52 units daily  Continue Mounjaro 10 mg subQ once weekly Continue Dexcom G6 CGM  Follow up: 09/15/21  This  appointment required 9 minutes of patient care (this includes precharting, chart review, review of results, virtual care, etc.).  Time spent since 07/29/21 - 08/25/21: 9 minutes  -08/11/21: 8 minutes (billed no charge) -08/25/21: 17  minutes (billed no charge)  Thank you for involving clinical pharmacist/diabetes educator to assist in providing this patient's care.   Zachery Conch, PharmD, BCACP, CDCES, CPP   I have reviewed the following documentation and I am in agreement with the plan. I was immediately available to the clinical pharmacist for questions and collaboration.  Dessa Phi, MD

## 2021-08-26 ENCOUNTER — Other Ambulatory Visit: Payer: Self-pay

## 2021-09-07 ENCOUNTER — Ambulatory Visit (INDEPENDENT_AMBULATORY_CARE_PROVIDER_SITE_OTHER): Payer: Medicaid Other | Admitting: Pediatric Endocrinology

## 2021-09-14 ENCOUNTER — Other Ambulatory Visit (INDEPENDENT_AMBULATORY_CARE_PROVIDER_SITE_OTHER): Payer: Self-pay | Admitting: Pediatric Endocrinology

## 2021-09-15 ENCOUNTER — Ambulatory Visit (INDEPENDENT_AMBULATORY_CARE_PROVIDER_SITE_OTHER): Payer: Medicaid Other | Admitting: Pharmacist

## 2021-09-15 NOTE — Progress Notes (Deleted)
This is a Pediatric Specialist virtual follow up consult provided via telephone. ?Holly [name] consented to an telephone visit consult today.  ?Location of patient: Holly [name]  is at home. ?Location of provider: Zachery Conch, PharmD, BCACP, CDCES, CPP is at office.  ? ?I connected with Holly [name] on 09/15/21 by telephone and verified that I am speaking with the correct person using two identifiers. *** ? ? ?DM medications ?Basal insulin: Tresiba 200 units/mL 52 units once daily  ?GLP-1 agonist: Mounjaro 10 mg subQ once weekly (last increased 08/20/21; Thursdays) ? ?Dexcom G6 CGM Report ? ? ?Assessment ?TIR is not at goal > 70%; but has improved from 11% (08/25/21) --> 28% (09/15/21). No episodes of hypoglycemia. ***  Will increase Tresiba from 46 units once daily to 52 units once daily (0.69 units/kg; >0.5 units/kg but patient is likely going through puberty so may require TDD doses as high as 2.0 units/kg/day which would be 1.0 units/kg/day for 50% basal, 50% bolus; discussed overbasalization risk with pateient). Continue wearing Dexcom G6 CGM. Follow up *** ? ?Plan ?*** Tresiba 200 units/mL 52 units daily  ?*** Mounjaro 10 mg subQ once weekly ?Continue Dexcom G6 CGM  ?Follow up: *** ? ?This appointment required *** minutes of patient care (this includes precharting, chart review, review of results, virtual care, etc.). ? ?Time spent since 08/26/21 - 09/25/21: *** minutes  ?-09/15/21: *** minutes (billed no charge) ? ?Thank you for involving clinical pharmacist/diabetes educator to assist in providing this patient's care.  ? ?Zachery Conch, PharmD, BCACP, CDCES, CPP ? ? ? ? ?

## 2021-09-16 ENCOUNTER — Ambulatory Visit (INDEPENDENT_AMBULATORY_CARE_PROVIDER_SITE_OTHER): Payer: Medicaid Other | Admitting: Pediatric Endocrinology

## 2021-09-22 ENCOUNTER — Telehealth (INDEPENDENT_AMBULATORY_CARE_PROVIDER_SITE_OTHER): Payer: Self-pay | Admitting: Pharmacist

## 2021-09-22 ENCOUNTER — Ambulatory Visit (INDEPENDENT_AMBULATORY_CARE_PROVIDER_SITE_OTHER): Payer: Medicaid Other | Admitting: Pharmacist

## 2021-09-22 NOTE — Telephone Encounter (Signed)
Called patient on 09/22/2021 at 4:34 PM and left HIPAA-compliant VM with instructions to call [facility] back. Attempted to contact patient twice. Phone went straight to voicemail each time. ? ?Plan to discuss diabetes management. If patient returns call please advise her to contact me via mychart her availability to reschedule sugar call. ? ?Thank you for involving pharmacy/diabetes educator to assist in providing this patient's care.  ? ?Zachery Conch, PharmD, BCACP, CDCES, CPP ? ? ? ?

## 2021-09-22 NOTE — Progress Notes (Deleted)
This is a Pediatric Specialist virtual follow up consult provided via telephone. ?Holly [name] consented to an telephone visit consult today.  ?Location of patient: Holly [name]  is at home. ?Location of provider: Zachery Conch, PharmD, BCACP, CDCES, CPP is at office.  ? ?I connected with Holly [name] on 09/22/21 by telephone and verified that I am speaking with the correct person using two identifiers. *** ? ? ?DM medications ?Basal insulin: Tresiba 200 units/mL 52 units once daily  ?GLP-1 agonist: Mounjaro 10 mg subQ once weekly (last increased 08/20/21; Thursdays) ? ?Dexcom G6 CGM Report ? ? ? ?Assessment ?TIR is not at goal > 70%; but has improved from 11% (08/25/21) --> 28% (09/15/21). No episodes of hypoglycemia. ***  Will increase Tresiba from 46 units once daily to 52 units once daily (0.69 units/kg; >0.5 units/kg but patient is likely going through puberty so may require TDD doses as high as 2.0 units/kg/day which would be 1.0 units/kg/day for 50% basal, 50% bolus; discussed overbasalization risk with pateient). Continue wearing Dexcom G6 CGM. Follow up *** ? ?Plan ?*** Tresiba 200 units/mL 52 units daily  ?*** Mounjaro 10 mg subQ once weekly ?Continue Dexcom G6 CGM  ?Follow up: *** ? ?This appointment required *** minutes of patient care (this includes precharting, chart review, review of results, virtual care, etc.). ? ?Time spent since 08/26/21 - 09/25/21: *** minutes  ?-09/22/21: *** minutes (billed no charge) ? ?Thank you for involving clinical pharmacist/diabetes educator to assist in providing this patient's care.  ? ?Zachery Conch, PharmD, BCACP, CDCES, CPP ? ? ? ? ?

## 2021-09-29 ENCOUNTER — Ambulatory Visit (INDEPENDENT_AMBULATORY_CARE_PROVIDER_SITE_OTHER): Payer: Medicaid Other | Admitting: Pediatric Endocrinology

## 2021-09-29 ENCOUNTER — Encounter (INDEPENDENT_AMBULATORY_CARE_PROVIDER_SITE_OTHER): Payer: Self-pay | Admitting: Pediatric Endocrinology

## 2021-09-29 VITALS — BP 122/80 | HR 92 | Ht 62.99 in | Wt 162.2 lb

## 2021-09-29 DIAGNOSIS — Z794 Long term (current) use of insulin: Secondary | ICD-10-CM | POA: Diagnosis not present

## 2021-09-29 DIAGNOSIS — E119 Type 2 diabetes mellitus without complications: Secondary | ICD-10-CM

## 2021-09-29 DIAGNOSIS — E1165 Type 2 diabetes mellitus with hyperglycemia: Secondary | ICD-10-CM

## 2021-09-29 LAB — POCT GLUCOSE (DEVICE FOR HOME USE): Glucose Fasting, POC: 178 mg/dL — AB (ref 70–99)

## 2021-09-29 MED ORDER — MOUNJARO 12.5 MG/0.5ML ~~LOC~~ SOAJ
12.5000 mg | SUBCUTANEOUS | 5 refills | Status: DC
Start: 2021-09-29 — End: 2021-11-11

## 2021-09-29 NOTE — Patient Instructions (Signed)
Increase Mounjaro to 12.5 mg ? ?Decrease concentrated sweets like the pineapple juice. Try a pro-biotic or acidophilus supplement instead.  ?

## 2021-09-29 NOTE — Progress Notes (Signed)
Subjective:  ?Subjective  ?Patient Name: Holly SellYesenia [name] Date of Birth: 05-04-2001  MRN: [id number] ? ?Holly [name] [name]  presents to the office today for follow up evaluation and management of her  diabetes ? ?HISTORY OF PRESENT ILLNESS:  ? ?Holly [name] is a 21 y.o. Hispanic female  ? ?Holly [name] was unaccompanied ? ?1. Holly [name] was seen in pediatric clinic in June 2018 for her 16 year WCC. At that visit they obtained labs which revealed a hemoglobin a1c of 12%. Her PCP called her to go to the ER for evaluation but family was in [place] for vacation. [facility] and had her initial evaluation there. She was antibody negative for GAD, Islet Cell and Insulin antibodies.  She was started on Lantus with sliding scale only Novolog. She was instructed to limit carbs to 60 grams per meal.  She transferred care to our clinic in July 2018 after returning from [place].  ? ?2. Holly [name] was last seen in pediatric endocrine clinic on 06/04/21 . In the interim she has been generally healthy.  ? ?She quit her job at [employer] and is now working full time at [employer].  ? ?She has continued on [facility] and feels that she is doing "pretty good" with it now. She is on 10 mg.  ? ?She is not as tired anymore.  ? ?She feels that her glycemic control has been better lately. She is also taking 52 units of [place]iba daily. She has not missed any.  ? ?She admits that she is eating oreo milkshakes at night after work, pineapple juice. ? ?Tresiba 52 units ?Mounjaro Dose 10mg  on Thursdays ? ?3. Pertinent Review of Systems:  ?Constitutional: The patient feels "good". The patient seems healthy and active.  ?Eyes: Vision seems to be good. There are no recognized eye problems.  ?Neck: The patient has no complaints of anterior neck swelling, soreness, tenderness, pressure, discomfort, or difficulty swallowing.   ?Heart: Heart rate increases with exercise or other physical activity. The patient has no  complaints of palpitations, irregular heart beats, chest pain, or chest pressure.   ?Lungs: no asthma or wheezing ?Gastrointestinal: Bowel movents seem normal. The patient has no complaints of excessive hunger, acid reflux, upset stomach, stomach aches or pains, diarrhea, or constipation.  ?Legs: Muscle mass and strength seem normal. There are no complaints of numbness, tingling, burning, or pain. No edema is noted.  ?Feet: There are no obvious foot problems. There are no complaints of numbness, tingling, burning, or pain. No edema is noted. ?Neurologic: There are no recognized problems with muscle movement and strength, sensation, or coordination. ?GYN/GU:  LMP 09/24/21   ?Skin: mild acne. Stretch marks.  ? ?Diabetes ID: None- does have it on her phone  ? ?Injection sites: stomach.  ? ?Annual Labs - Done March 2022- Due today! ? ?  ?Dexcom CGM:   ? ? ? ? ? ? ? ?  ?  ? ?       ?PAST MEDICAL, FAMILY, AND SOCIAL HISTORY ? ?Past Medical History:  ?Diagnosis Date  ? Diabetes mellitus without complication (HCC)   ? Mild acne 12/04/2013  ? Obesity, unspecified 12/04/2013  ? ? ?Family History  ?Problem Relation Age of Onset  ? Diabetes Mother   ? ?Mom and maternal uncles with type 2 diabetes.  ? ?Current Outpatient Medications:  ?  BD PEN NEEDLE NANO 2ND GEN 32G X 4 MM MISC, USE TO INJECT INSULIN 6 TIMES DAILY, Disp: 200 each, Rfl: 5 ?  Clindamycin-Benzoyl Per, Refr, gel, Apply 1 application topically 2 (two) times daily., Disp: 45 g, Rfl: 1 ?  Continuous Blood Gluc Sensor (DEXCOM G6 SENSOR) MISC, USE 1 SENSOR EVERY 10 DAYS AS DIRECTED, Disp: 3 each, Rfl: 11 ?  Continuous Blood Gluc Transmit (DEXCOM G6 TRANSMITTER) MISC, 1 each by Does not apply route every 3 (three) months., Disp: 1 each, Rfl: 3 ?  insulin degludec (TRESIBA FLEXTOUCH) 200 UNIT/ML FlexTouch Pen, Up to 120 units per day as directed by physician, Disp: 30 mL, Rfl: 11 ?  meclizine (ANTIVERT) 25 MG tablet, Take 1 tablet (25 mg total) by mouth 3 (three) times daily  as needed for dizziness., Disp: 30 tablet, Rfl: 0 ?  tirzepatide (MOUNJARO) 10 MG/0.5ML Pen, Inject 10 mg into the skin once a week., Disp: 2 mL, Rfl: 2 ?  tirzepatide (MOUNJARO) 12.5 MG/0.5ML Pen, Inject 12.5 mg into the skin once a week., Disp: 2 mL, Rfl: 5 ?  ACCU-CHEK FASTCLIX LANCETS MISC, Check sugar 6 x daily (Patient not taking: Reported on 09/29/2021), Disp: 204 each, Rfl: 5 ?  Ascorbic Acid (VITAMIN C ADULT GUMMIES PO), Take by mouth. (Patient not taking: Reported on 12/03/2020), Disp: , Rfl:  ?  Continuous Blood Gluc Receiver (DEXCOM G6 RECEIVER) DEVI, 1 each by Does not apply route as directed. (Patient not taking: Reported on 09/29/2021), Disp: 1 each, Rfl: 1 ?  dimenhyDRINATE (DRAMAMINE) 50 MG tablet, Take by mouth every 8 (eight) hours as needed. (Patient not taking: Reported on 06/04/2021), Disp: , Rfl:  ?  fluconazole (DIFLUCAN) 150 MG tablet, Take 1 tablet (150 mg total) by mouth as needed. (Patient not taking: Reported on 09/29/2021), Disp: 5 tablet, Rfl: 0 ?  Glucagon (BAQSIMI TWO PACK) 3 MG/DOSE POWD, Place 1 each into the nose as needed (severe hypoglycmia with unresponsiveness). (Patient not taking: Reported on 01/24/2020), Disp: 1 each, Rfl: 3 ?  glucose blood (ACCU-CHEK GUIDE) test strip, USE TO CHECK SUGAR LEVELS SIX TIMES DAILY (Patient not taking: Reported on 09/29/2021), Disp: 200 strip, Rfl: 5 ?  hydrOXYzine (ATARAX/VISTARIL) 10 MG tablet, Take 1 tablet (10 mg total) by mouth 3 (three) times daily as needed., Disp: 60 tablet, Rfl: 2 ?  metFORMIN (GLUCOPHAGE-XR) 750 MG 24 hr tablet, Take 1 tablet (750 mg total) by mouth in the morning and at bedtime. (Patient not taking: Reported on 09/29/2021), Disp: 60 tablet, Rfl: 5 ?  NOVOLOG FLEXPEN 100 UNIT/ML FlexPen, INJECT UP TO 100 UNITS UNDER THE SKIN DAILY PER DIABETES CARE PLAN (Patient not taking: Reported on 12/03/2020), Disp: 30 mL, Rfl: 5 ?  ondansetron (ZOFRAN-ODT) 8 MG disintegrating tablet, Take 1 tablet (8 mg total) by mouth every 8 (eight) hours as  needed for nausea or vomiting. (Patient not taking: Reported on 12/03/2020), Disp: 20 tablet, Rfl: 0 ? ?Allergies as of 09/29/2021  ? (No Known Allergies)  ? ? ? reports that she has never smoked. She has never used smokeless tobacco. She reports that she does not drink alcohol and does not use drugs. ?Pediatric History  ?Patient Parents  ? Cervantes,Vitalina (Mother)  ? Cabrera,Arturo (Father)  ? ?Other Topics Concern  ? Not on file  ?Social History Narrative  ? Graduated from [employer]. Currently working at [employer].   ? ? ?1. School and Family:  Lives with parents and sister. Eye lashes and nails. 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 Activities: She is not currently exercising outside of work.  ?3. Primary Care Provider: Clifton Custard, MD ? ?ROS:  There are no other significant problems involving Donnita's other body systems. ?  ? Objective:  ?Objective   ?Vital Signs:   ? ?BP 122/80 (BP Location: Right Arm, Patient Position: Sitting, Cuff Size: Large)   Pulse 92   Ht 5' 2.99" (1.6 m)   Wt 162 lb 3.2 oz (73.6 kg)   LMP 09/24/2021   BMI 28.74 kg/m?  ? Blood pressure percentiles are not available for patients who are 18 years or older. ? ?Ht Readings from Last 3 Encounters:  ?09/29/21 5' 2.99" (1.6 m) (30 %, Z= -0.51)*  ?05/29/20 5\' 3"  (1.6 m) (31 %, Z= -0.49)*  ?04/08/20 5\' 2"  (1.575 m) (19 %, Z= -0.88)*  ? ?* Growth percentiles are based on CDC (Girls, 2-20 Years) data.  ? ?Wt Readings from Last 3 Encounters:  ?09/29/21 162 lb 3.2 oz (73.6 kg) (88 %, Z= 1.19)*  ?06/04/21 164 lb 9.6 oz (74.7 kg) (90 %, Z= 1.27)*  ?03/05/21 172 lb 6 oz (78.2 kg) (93 %, Z= 1.46)*  ? ?* Growth percentiles are based on CDC (Girls, 2-20 Years) data.  ? ?HC Readings from Last 3 Encounters:  ?No data found for [facility]  ? ?Body surface area is 1.81 meters squared.  ?30 %ile (Z= -0.51) based on CDC (Girls, 2-20 Years) Stature-for-age data based on Stature recorded on 09/29/2021. ?88 %ile (Z= 1.19) based on CDC (Girls, 2-20 Years)  weight-for-age data using vitals from 09/29/2021. ?Constitutional: The patient appears healthy and well nourished. The patient's height and weight are normal for age. She has lost 2 pounds since last visit  ?Head: The h

## 2021-09-30 LAB — T4, FREE: Free T4: 1.1 ng/dL (ref 0.8–1.4)

## 2021-09-30 LAB — CBC WITH DIFFERENTIAL/PLATELET
Absolute Monocytes: 557 cells/uL (ref 200–950)
Basophils Absolute: 70 cells/uL (ref 0–200)
Basophils Relative: 0.6 %
Eosinophils Absolute: 325 cells/uL (ref 15–500)
Eosinophils Relative: 2.8 %
HCT: 44.8 % (ref 35.0–45.0)
Hemoglobin: 13.6 g/dL (ref 11.7–15.5)
Lymphs Abs: 3260 cells/uL (ref 850–3900)
MCH: 23 pg — ABNORMAL LOW (ref 27.0–33.0)
MCHC: 30.4 g/dL — ABNORMAL LOW (ref 32.0–36.0)
MCV: 75.8 fL — ABNORMAL LOW (ref 80.0–100.0)
MPV: 10.5 fL (ref 7.5–12.5)
Monocytes Relative: 4.8 %
Neutro Abs: 7389 cells/uL (ref 1500–7800)
Neutrophils Relative %: 63.7 %
Platelets: 417 10*3/uL — ABNORMAL HIGH (ref 140–400)
RBC: 5.91 10*6/uL — ABNORMAL HIGH (ref 3.80–5.10)
RDW: 13.9 % (ref 11.0–15.0)
Total Lymphocyte: 28.1 %
WBC: 11.6 10*3/uL — ABNORMAL HIGH (ref 3.8–10.8)

## 2021-09-30 LAB — COMPREHENSIVE METABOLIC PANEL
AG Ratio: 1.5 (calc) (ref 1.0–2.5)
ALT: 14 U/L (ref 5–32)
AST: 14 U/L (ref 12–32)
Albumin: 4.4 g/dL (ref 3.6–5.1)
Alkaline phosphatase (APISO): 103 U/L (ref 36–128)
BUN/Creatinine Ratio: 28 (calc) — ABNORMAL HIGH (ref 6–22)
BUN: 13 mg/dL (ref 7–20)
CO2: 22 mmol/L (ref 20–32)
Calcium: 9.6 mg/dL (ref 8.9–10.4)
Chloride: 104 mmol/L (ref 98–110)
Creat: 0.46 mg/dL — ABNORMAL LOW (ref 0.50–0.96)
Globulin: 3 g/dL (calc) (ref 2.0–3.8)
Glucose, Bld: 183 mg/dL — ABNORMAL HIGH (ref 65–99)
Potassium: 4.3 mmol/L (ref 3.8–5.1)
Sodium: 139 mmol/L (ref 135–146)
Total Bilirubin: 0.8 mg/dL (ref 0.2–1.1)
Total Protein: 7.4 g/dL (ref 6.3–8.2)

## 2021-09-30 LAB — HEMOGLOBIN A1C
Hgb A1c MFr Bld: 8.4 % of total Hgb — ABNORMAL HIGH (ref <5.7)
Mean Plasma Glucose: 194 mg/dL
eAG (mmol/L): 10.8 mmol/L

## 2021-09-30 LAB — LIPID PANEL
Cholesterol: 148 mg/dL (ref <170)
HDL: 39 mg/dL — ABNORMAL LOW (ref >45)
LDL Cholesterol (Calc): 92 mg/dL (calc) (ref <110)
Non-HDL Cholesterol (Calc): 109 mg/dL (calc) (ref <120)
Total CHOL/HDL Ratio: 3.8 (calc) (ref <5.0)
Triglycerides: 77 mg/dL (ref <90)

## 2021-09-30 LAB — TSH: TSH: 2.76 mIU/L

## 2021-09-30 LAB — C-PEPTIDE: C-Peptide: 1.12 ng/mL (ref 0.80–3.85)

## 2021-11-10 ENCOUNTER — Encounter (INDEPENDENT_AMBULATORY_CARE_PROVIDER_SITE_OTHER): Payer: Self-pay | Admitting: Pharmacist

## 2021-11-11 ENCOUNTER — Telehealth (INDEPENDENT_AMBULATORY_CARE_PROVIDER_SITE_OTHER): Payer: Self-pay | Admitting: Pediatric Endocrinology

## 2021-11-11 ENCOUNTER — Other Ambulatory Visit (INDEPENDENT_AMBULATORY_CARE_PROVIDER_SITE_OTHER): Payer: Self-pay | Admitting: Pharmacist

## 2021-11-11 ENCOUNTER — Other Ambulatory Visit (HOSPITAL_BASED_OUTPATIENT_CLINIC_OR_DEPARTMENT_OTHER): Payer: Self-pay

## 2021-11-11 ENCOUNTER — Ambulatory Visit (INDEPENDENT_AMBULATORY_CARE_PROVIDER_SITE_OTHER): Payer: Medicaid Other | Admitting: Pharmacist

## 2021-11-11 DIAGNOSIS — E1165 Type 2 diabetes mellitus with hyperglycemia: Secondary | ICD-10-CM

## 2021-11-11 DIAGNOSIS — E119 Type 2 diabetes mellitus without complications: Secondary | ICD-10-CM

## 2021-11-11 MED ORDER — MOUNJARO 12.5 MG/0.5ML ~~LOC~~ SOAJ
12.5000 mg | SUBCUTANEOUS | 5 refills | Status: DC
  Filled 2021-11-11: qty 2, 28d supply, fill #0

## 2021-11-11 NOTE — Telephone Encounter (Signed)
Please refer to sugar call telephone call documentation from today 11/11/21 ? ? ?Thank you for involving clinical pharmacist/diabetes educator to assist in providing this patient's care.  ? ?Zachery Conch, PharmD, BCACP, CDCES, CPP ? ?

## 2021-11-11 NOTE — Telephone Encounter (Signed)
?  Name of who is calling: ?[facility]  ?Caller's Relationship to Patient: ?Self ?Best contact number: ?[phone] ? ?Provider they see: ?Badik/ Ladona Ridgel ? ?Reason for call: ?Marquita wants to know how to go about her health plan, she stated that she spoke with the Pharmacy and they are out of stock everywhere for the [facility] 12.5mg . She stated she has been out since last week but has been taking the previous meds with a lower dosage (10mg ) since she has been out. She wanted to know if our office has any samples that she could use. She would like a call back asap.  ? ? ? ?PRESCRIPTION REFILL ONLY ? ?Name of prescription: ? ?Pharmacy: ? ? ?

## 2021-11-11 NOTE — Progress Notes (Addendum)
This is a Pediatric Specialist virtual follow up consult provided via telephone. ?Holly [name] consented to an telephone visit consult today.  ?Location of patient: Holly [name]  is at home. ?Location of provider: Zachery [name], PharmD, BCACP, CDCES, CPP is at office.  ? ?I connected with Holly [name] on 11/11/21 by telephone and verified that I am speaking with the correct person using two identifiers. She took Mounjaro 10 mg last Friday as she has not been able to get Mounjaro 12.5 mg from the pharmacy. ? ? ?DM medications ?Basal insulin: Tresiba 200 units/mL 52 units once daily  ?GLP-1 agonist: Mounjaro 10 mg subQ once weekly (last increased 08/20/21; Friday ? ?Dexcom G6 CGM Report ? ? ?Assessment ?TIR is not at goal > 70%, but has improved from 29% --> 42%. No episodes of hypoglycemia. It is time to increase Mounjaro considering she increased to 10 mg in February 2023 and she remains tolerating Mounjaro. Contacted Holly [name] ([name]) who informed me [name] has been successfully able to order [name]. Contacted pharmacist there who confirmed they have Mounjaro 12.5 mg in stock. Contacted Holly [name] and advised her to pick up [name] from that pharmacy and if there are any issues to contact me. Increase Tresiba from 52 units once daily --> Tresiba 56 units daily considering trend of post prandial hyperglycemia throughout entire dya and night. Continue wearing Dexcom G6 CGM. Follow up 11/24/21 ? ?Plan ?Increase Tresiba 52 units once daily --> Tresiba 56 units daily  ?Increase Mounjaro 10 mg subQ once weekly --> 12.5 mg subQ once weekly (dose increase scheduled for 11/13/21; Fridays) ?Continue Dexcom G6 CGM  ?Follow up: 11/24/21 ? ?This appointment required 9 minutes of patient care (this includes precharting, chart review, review of results, virtual care, etc.). ? ?Time spent since 10/26/21 - 11/25/21: 9 minutes  ?-11/24/21: 9 minutes  (billed no charge) ? ?Thank you for involving clinical pharmacist/diabetes educator to assist in providing this patient's care.  ? ?Holly [name], PharmD, BCACP, CDCES, CPP ? ?I have reviewed the following documentation and I am in agreement with the plan. I was immediately available to the clinical pharmacist for questions and collaboration. ? ?Holly Phi, MD ? ? ?

## 2021-11-12 ENCOUNTER — Other Ambulatory Visit (HOSPITAL_BASED_OUTPATIENT_CLINIC_OR_DEPARTMENT_OTHER): Payer: Self-pay

## 2021-11-24 ENCOUNTER — Other Ambulatory Visit (HOSPITAL_BASED_OUTPATIENT_CLINIC_OR_DEPARTMENT_OTHER): Payer: Self-pay

## 2021-11-24 ENCOUNTER — Ambulatory Visit (INDEPENDENT_AMBULATORY_CARE_PROVIDER_SITE_OTHER): Payer: Medicaid Other | Admitting: Pharmacist

## 2021-11-24 DIAGNOSIS — E1165 Type 2 diabetes mellitus with hyperglycemia: Secondary | ICD-10-CM

## 2021-11-24 MED ORDER — MOUNJARO 15 MG/0.5ML ~~LOC~~ SOAJ
15.0000 mg | SUBCUTANEOUS | 5 refills | Status: DC
  Filled 2021-11-24 – 2021-12-08 (×3): qty 2, 28d supply, fill #0
  Filled 2022-01-16 – 2022-06-14 (×3): qty 2, 28d supply, fill #1

## 2021-11-24 MED ORDER — TRESIBA FLEXTOUCH 200 UNIT/ML ~~LOC~~ SOPN
PEN_INJECTOR | SUBCUTANEOUS | 11 refills | Status: DC
  Filled 2021-11-24: qty 18, 30d supply, fill #0
  Filled 2021-12-08: qty 30, 30d supply, fill #0
  Filled 2022-02-12: qty 18, 30d supply, fill #1
  Filled 2022-03-11: qty 18, 30d supply, fill #2
  Filled 2022-04-12: qty 18, 30d supply, fill #3
  Filled 2022-06-03: qty 18, 30d supply, fill #4
  Filled 2022-07-10: qty 18, 30d supply, fill #5
  Filled 2022-08-30: qty 18, 30d supply, fill #6
  Filled 2022-09-29 – 2022-11-14 (×2): qty 18, 30d supply, fill #7

## 2021-11-24 NOTE — Progress Notes (Signed)
This is a Pediatric Specialist virtual follow up consult provided via telephone. Holly [name] consented to an telephone visit consult today.  Location of patient: Holly [name]  is at home. Location of provider: Zachery Conch, PharmD, BCACP, CDCES, CPP is at office.   I connected with Holly [name] on 11/24/21 by telephone and verified that I am speaking with the correct person using two identifiers. She reports she increased Mounjaro 10 --> 12.5 mg subQ once weekly on 11/13/21. She is taking fiber gummies (5 mg, [name] brand) and probiotic [name] brand). She is also drinking premier protein shakes (boost).   DM medications Basal insulin: Tresiba 200 units/mL 56 units once daily  GLP-1 agonist: Mounjaro 12.5 mg subQ once weekly (last increased 11/13/21; Fridays)  Dexcom G6 CGM Report     Assessment TIR is not at goal > 70%, but has improved. Standard deviation also improved from 51 to 46. No episodes of hypoglycemia. Patient tolerating Mounjaro 12.5 mg; will plan to titrate dose after taking Mounjaro 12.5 mg weekly x 4 weeks (so will increase to 15 mg on June 9th 2023). Increase Tresiba from 56 units once daily --> Tresiba 62 units daily considering trend of post prandial hyperglycemia throughout entire dya and night. Advised her to increase fiber slowly by 5 g each week (2023 ADA recommends 14 g per 1400 kcal; advised her to titrate to 20-30 grams). Continue wearing Dexcom G6 CGM. Follow up Dr. Vanessa [place] on July 18th 2023.  Plan Increase Tresiba 200/mL 56 units daily --> 62 units daily  Increase Mounjaro 12.5 mg subQ once weekly --> 15 mg subQ once weekly (dose increase scheduled for 11/13/21; Fridays) Continue Dexcom G6 CGM  Follow up: Dr. Vanessa [place] on July 18th 2023  This appointment required 10 minutes of patient care (this includes precharting, chart review, review of results, virtual care, etc.).  Time spent since 10/26/21 - 11/25/21: 19 minutes  -11/11/21: 9 minutes  (billed no charge) -11/24/21: 10 minutes (billed no charge)  Thank you for involving clinical pharmacist/diabetes educator to assist in providing this patient's care.   Zachery Conch, PharmD, BCACP, CDCES, CPP

## 2021-11-25 ENCOUNTER — Other Ambulatory Visit (HOSPITAL_BASED_OUTPATIENT_CLINIC_OR_DEPARTMENT_OTHER): Payer: Self-pay

## 2021-11-26 ENCOUNTER — Other Ambulatory Visit (HOSPITAL_BASED_OUTPATIENT_CLINIC_OR_DEPARTMENT_OTHER): Payer: Self-pay

## 2021-12-07 ENCOUNTER — Other Ambulatory Visit (HOSPITAL_BASED_OUTPATIENT_CLINIC_OR_DEPARTMENT_OTHER): Payer: Self-pay

## 2021-12-08 ENCOUNTER — Other Ambulatory Visit (HOSPITAL_BASED_OUTPATIENT_CLINIC_OR_DEPARTMENT_OTHER): Payer: Self-pay

## 2021-12-08 ENCOUNTER — Encounter (INDEPENDENT_AMBULATORY_CARE_PROVIDER_SITE_OTHER): Payer: Self-pay | Admitting: Pediatric Endocrinology

## 2021-12-08 ENCOUNTER — Other Ambulatory Visit (INDEPENDENT_AMBULATORY_CARE_PROVIDER_SITE_OTHER): Payer: Self-pay | Admitting: Pediatric Endocrinology

## 2021-12-08 DIAGNOSIS — E1165 Type 2 diabetes mellitus with hyperglycemia: Secondary | ICD-10-CM

## 2021-12-08 DIAGNOSIS — E119 Type 2 diabetes mellitus without complications: Secondary | ICD-10-CM

## 2021-12-08 MED ORDER — DEXCOM G6 TRANSMITTER MISC: 3 refills | Status: DC

## 2021-12-08 MED ORDER — DEXCOM G6 TRANSMITTER MISC
1.0000 | 3 refills | Status: DC
  Filled 2021-12-08: qty 1, 90d supply, fill #0
  Filled 2022-02-12: qty 1, 90d supply, fill #1
  Filled 2022-03-11 – 2022-06-03 (×2): qty 1, 90d supply, fill #2
  Filled 2022-08-30: qty 1, 90d supply, fill #3

## 2021-12-16 ENCOUNTER — Encounter: Payer: Medicaid Other | Admitting: Internal Medicine

## 2021-12-30 ENCOUNTER — Ambulatory Visit: Payer: Medicaid Other | Admitting: Student

## 2022-01-12 ENCOUNTER — Encounter (INDEPENDENT_AMBULATORY_CARE_PROVIDER_SITE_OTHER): Payer: Self-pay | Admitting: Pediatric Endocrinology

## 2022-01-12 ENCOUNTER — Ambulatory Visit (INDEPENDENT_AMBULATORY_CARE_PROVIDER_SITE_OTHER): Payer: Medicaid Other | Admitting: Pediatric Endocrinology

## 2022-01-12 VITALS — BP 118/76 | HR 92 | Wt 164.7 lb

## 2022-01-12 DIAGNOSIS — R739 Hyperglycemia, unspecified: Secondary | ICD-10-CM

## 2022-01-12 DIAGNOSIS — Z794 Long term (current) use of insulin: Secondary | ICD-10-CM | POA: Diagnosis not present

## 2022-01-12 DIAGNOSIS — E119 Type 2 diabetes mellitus without complications: Secondary | ICD-10-CM | POA: Diagnosis not present

## 2022-01-12 LAB — POCT GLYCOSYLATED HEMOGLOBIN (HGB A1C): Hemoglobin A1C: 7.2 % — AB (ref 4.0–5.6)

## 2022-01-12 LAB — POCT GLUCOSE (DEVICE FOR HOME USE): Glucose Fasting, POC: 207 mg/dL — AB (ref 70–99)

## 2022-01-12 NOTE — Progress Notes (Signed)
Subjective:  Subjective  Patient Name: Holly [name] Date of Birth: 07/15/2000  MRN: [id number]  Holly [name]  presents to the office today for follow up evaluation and management of her  diabetes  HISTORY OF PRESENT ILLNESS:   Holly [name] is a 21 y.o. Hispanic female   Holly [name] was unaccompanied  1. Holly [name] was seen in pediatric clinic in June 2018 for her 16 year WCC. At that visit they obtained labs which revealed a hemoglobin a1c of 12%. Her PCP called her to go to the ER for evaluation but family was in [place] for vacation. She went to [facility] and had her initial evaluation there. She was antibody negative for GAD, Islet Cell and Insulin antibodies.  She was started on Lantus with sliding scale only Novolog. She was instructed to limit carbs to 60 grams per meal.  She transferred care to our clinic in July 2018 after returning from [place].   2. Holly [name] was last seen in pediatric endocrine clinic on 09/29/21 . In the interim she has been generally healthy.   She is now taking 15 mg of Mounjaro and 62 units of Tresiba. She thinks that Dr. Ladona Ridgel wanted her to increase to 65 units on the Tresiba but she did not do that. She feels that her sugars are "okay". She says that she is having trouble remembering to take her [place]. She feels that she is taking it 5 days a week.   She usually takes her [place] at night. When she wakes up the next day and looks at her sugar she realizes that she forgot. She is not currently wearing her Dexcom because she needs to do a refill.   She reports that she thought that she had to take her Tresiba at the same time every day and that she usually takes it at night. She is tired after work and often falls asleep without taking it. She remembers the next morning or mid day the next day. She did not remember that she could take 2 doses in the same day as long at they are at least 8 hours apart.   She is working full  time at [employer].    Tresiba 62 units Mounjaro Dose 15 mg on Saturday  3. Pertinent Review of Systems:  Constitutional: The patient feels "tired". The patient seems healthy and active.  Eyes: Vision seems to be good. There are no recognized eye problems.  Neck: The patient has no complaints of anterior neck swelling, soreness, tenderness, pressure, discomfort, or difficulty swallowing.   Heart: Heart rate increases with exercise or other physical activity. The patient has no complaints of palpitations, irregular heart beats, chest pain, or chest pressure.   Lungs: no asthma or wheezing Gastrointestinal: Bowel movents seem normal. The patient has no complaints of excessive hunger, acid reflux, upset stomach, stomach aches or pains, diarrhea, or constipation.  Legs: Muscle mass and strength seem normal. There are no complaints of numbness, tingling, burning, or pain. No edema is noted.  Feet: There are no obvious foot problems. There are no complaints of numbness, tingling, burning, or pain. No edema is noted. Neurologic: There are no recognized problems with muscle movement and strength, sensation, or coordination. GYN/GU:  LMP 7/16. Regular.  Skin: mild acne. Stretch marks.   Diabetes ID: None- does have it on her phone   Injection sites: stomach.   Annual Labs - Done April 2023- no issues    Dexcom CGM:  PAST MEDICAL, FAMILY, AND SOCIAL HISTORY  Past Medical History:  Diagnosis Date   Diabetes mellitus without complication (HCC)    Mild acne 12/04/2013   Obesity, unspecified 12/04/2013    Family History  Problem Relation Age of Onset   Diabetes Mother    Mom and maternal uncles with type 2 diabetes.   Current Outpatient Medications:    ACCU-CHEK FASTCLIX LANCETS MISC, Check sugar 6 x daily, Disp: 204 each, Rfl: 5   Continuous Blood Gluc Sensor (DEXCOM G6 SENSOR) MISC, USE 1 SENSOR EVERY 10 DAYS AS DIRECTED, Disp: 3 each, Rfl: 11   Continuous Blood  Gluc Transmit (DEXCOM G6 TRANSMITTER) MISC, 1 each by Does not apply route every 3 (three) months., Disp: 1 each, Rfl: 3   glucose blood (ACCU-CHEK GUIDE) test strip, USE TO CHECK SUGAR LEVELS SIX TIMES DAILY, Disp: 200 strip, Rfl: 5   insulin degludec (TRESIBA FLEXTOUCH) 200 UNIT/ML FlexTouch Pen, Inject up to 120 units daily as directed by physician, Disp: 30 mL, Rfl: 11   tirzepatide (MOUNJARO) 15 MG/0.5ML Pen, Inject 15 mg into the skin once a week., Disp: 2 mL, Rfl: 5   Ascorbic Acid (VITAMIN C ADULT GUMMIES PO), Take by mouth. (Patient not taking: Reported on 12/03/2020), Disp: , Rfl:    BD PEN NEEDLE NANO 2ND GEN 32G X 4 MM MISC, USE TO INJECT INSULIN 6 TIMES DAILY (Patient not taking: Reported on 01/12/2022), Disp: 200 each, Rfl: 5   Clindamycin-Benzoyl Per, Refr, gel, Apply 1 application topically 2 (two) times daily. (Patient not taking: Reported on 01/12/2022), Disp: 45 g, Rfl: 1   Continuous Blood Gluc Receiver (DEXCOM G6 RECEIVER) DEVI, 1 each by Does not apply route as directed. (Patient not taking: Reported on 01/12/2022), Disp: 1 each, Rfl: 1   Continuous Blood Gluc Transmit (DEXCOM G6 TRANSMITTER) MISC, 1 EACH BUT DOES NOT APPLY ROUTE EVERY 3 MONTHS. (Patient not taking: Reported on 01/12/2022), Disp: 1 each, Rfl: 3   dimenhyDRINATE (DRAMAMINE) 50 MG tablet, Take by mouth every 8 (eight) hours as needed. (Patient not taking: Reported on 06/04/2021), Disp: , Rfl:    fluconazole (DIFLUCAN) 150 MG tablet, Take 1 tablet (150 mg total) by mouth as needed. (Patient not taking: Reported on 09/29/2021), Disp: 5 tablet, Rfl: 0   Glucagon (BAQSIMI TWO PACK) 3 MG/DOSE POWD, Place 1 each into the nose as needed (severe hypoglycmia with unresponsiveness). (Patient not taking: Reported on 01/24/2020), Disp: 1 each, Rfl: 3   hydrOXYzine (ATARAX/VISTARIL) 10 MG tablet, Take 1 tablet (10 mg total) by mouth 3 (three) times daily as needed. (Patient not taking: Reported on 01/12/2022), Disp: 60 tablet, Rfl: 2    insulin degludec (TRESIBA FLEXTOUCH) 200 UNIT/ML FlexTouch Pen, ADMINISTER UP TO 120 UNITS UNDER THE SKIN EVERY DAY AS DIRECTED BY PRESCRIBER (Patient not taking: Reported on 01/12/2022), Disp: 30 mL, Rfl: 11   meclizine (ANTIVERT) 25 MG tablet, Take 1 tablet (25 mg total) by mouth 3 (three) times daily as needed for dizziness. (Patient not taking: Reported on 01/12/2022), Disp: 30 tablet, Rfl: 0   metFORMIN (GLUCOPHAGE-XR) 750 MG 24 hr tablet, Take 1 tablet (750 mg total) by mouth in the morning and at bedtime. (Patient not taking: Reported on 09/29/2021), Disp: 60 tablet, Rfl: 5   NOVOLOG FLEXPEN 100 UNIT/ML FlexPen, INJECT UP TO 100 UNITS UNDER THE SKIN DAILY PER DIABETES CARE PLAN (Patient not taking: Reported on 12/03/2020), Disp: 30 mL, Rfl: 5   ondansetron (ZOFRAN-ODT) 8 MG disintegrating tablet, Take 1 tablet (8  mg total) by mouth every 8 (eight) hours as needed for nausea or vomiting. (Patient not taking: Reported on 12/03/2020), Disp: 20 tablet, Rfl: 0  Allergies as of 01/12/2022   (No Known Allergies)     reports that she has never smoked. She has never used smokeless tobacco. She reports that she does not drink alcohol and does not use drugs. Pediatric History  Patient Parents   Cervantes,Vitalina (Mother)   Cabrera,Arturo (Father)   Other Topics Concern   Not on file  Social History Narrative   Graduated from [employer]. Currently working at [employer].     1. School and Family:  Lives with parents and sister. Eye lashes and nails. 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 Activities: She is not currently exercising outside of work.  3. Primary Care Provider: Pcp, No  ROS: There are no other significant problems involving Audri's other body systems.    Objective:  Objective   Vital Signs:    BP 118/76 (BP Location: Right Arm, Patient Position: Sitting)   Pulse 92   Wt 164 lb 11.2 oz (74.7 kg)   LMP 01/10/2022 (Exact Date)   BMI 29.18 kg/m   Growth %ile SmartLinks can only be  used for patients less than 31 years old.  Ht Readings from Last 3 Encounters:  09/29/21 5' 2.99" (1.6 m) (30 %, Z= -0.51)*  05/29/20 5\' 3"  (1.6 m) (31 %, Z= -0.49)*  04/08/20 5\' 2"  (1.575 m) (19 %, Z= -0.88)*   * Growth percentiles are based on CDC (Girls, 2-20 Years) data.   Wt Readings from Last 3 Encounters:  01/12/22 164 lb 11.2 oz (74.7 kg)  09/29/21 162 lb 3.2 oz (73.6 kg) (88 %, Z= 1.19)*  06/04/21 164 lb 9.6 oz (74.7 kg) (90 %, Z= 1.27)*   * Growth percentiles are based on CDC (Girls, 2-20 Years) data.   HC Readings from Last 3 Encounters:  No data found for [facility]   Body surface area is 1.82 meters squared.  Facility age limit for growth %iles is 20 years. Facility age limit for growth %iles is 20 years. Constitutional: The patient appears healthy and well nourished. The patient's height and weight are normal for age. She has lost 2 pounds since last visit  Head: The head is normocephalic. Face: The face appears normal. There are no obvious dysmorphic features. Eyes: The eyes appear to be normally formed and spaced. Gaze is conjugate. There is no obvious arcus or proptosis. Moisture appears normal. Ears: The ears are normally placed and appear externally normal. Mouth: The oropharynx and tongue appear normal. Dentition appears to be normal for age. Oral moisture is normal. Neck: The neck appears to be visibly normal. The consistency of the thyroid gland is normal. The thyroid gland is not tender to palpation. Lungs: No increased work of breathing. CTA.  Heart: Normal pulses and peripheral perfusion. RRR S1S2 Abdomen: The abdomen appears to be normal in size for the patient's age. . There is no obvious hepatomegaly, splenomegaly, or other mass effect.  Arms: Muscle size and bulk are normal for age. Hands: There is no obvious tremor. Phalangeal and metacarpophalangeal joints are normal. Palmar muscles are normal for age. Palmar skin is normal. Palmar moisture is also  normal. Legs: Muscles appear normal for age. No edema is present. Feet: Feet are normally formed. Dorsalis pedal pulses are normal. Neurologic: Strength is normal for age in both the upper and lower extremities. Muscle tone is normal. Sensation to touch is  normal in both the legs and feet.   GYN/GU: normal female  Skin: moderate facial acne. No acanthosis noted. Has green discoloration around neck from necklace  LAB DATA:    Lab Results  Component Value Date   HGBA1C 7.2 (A) 01/12/2022   HGBA1C 8.4 (H) 09/29/2021   HGBA1C 8.5 (A) 06/04/2021   HGBA1C 8.1 (A) 03/05/2021   HGBA1C 7.9 (A) 12/03/2020   HGBA1C 8.8 (H) 09/02/2020   HGBA1C 8.2 (A) 05/29/2020   HGBA1C 8.5 (A) 01/24/2020     Results for orders placed or performed in visit on 01/12/22  POCT Glucose (Device for Home Use)  Result Value Ref Range   Glucose Fasting, POC 207 (A) 70 - 99 mg/dL   POC Glucose    POCT glycosylated hemoglobin (Hb A1C)  Result Value Ref Range   Hemoglobin A1C 7.2 (A) 4.0 - 5.6 %   HbA1c POC (<> result, manual entry)     HbA1c, POC (prediabetic range)     HbA1c, POC (controlled diabetic range)         Assessment and Plan:  Assessment  ASSESSMENT: Yailyn is a 21 y.o. 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 She was diagnosed at [facility] and was antibody negative based on records.   Diabetes    - POC CBG as above - A1C POC as above is not at goal (<6.5%) but is improved from last check  - Still taking Tresiba - but forgetting frequently - Tresiba 62 units. - increase to 66 units - Now taking Mounjaro 15 mg - Dexcom CGM Reviewed report in detail with patient and used this data to adjust Tresiba dose and discuss adherence with [place] dosing - She continues to feel good on the [place]. Discussed impact of concentrated sugars (pineapple juice!) on overall glycemic control.   PLAN:     1. Diagnostic: Lab Orders         POCT Glucose (Device for Home Use)          POCT glycosylated hemoglobin (Hb A1C)     2. Therapeutic: Medications as above. Increase Tresiba to 66 units. Continue Mounjaro 15 mg.  3. Patient education:  All discussion as above.  4. Follow-up: Return in about 3 months (around 04/14/2022).       Dessa Phi, MD  Level of Service: >30 minutes spent today reviewing the medical chart, counseling the patient/family, and documenting today's encounter.  When a patient is on insulin, intensive monitoring of blood glucose levels is necessary to avoid hyperglycemia and hypoglycemia. Severe hyperglycemia/hypoglycemia can lead to hospital admissions and be life threatening.    Patient referred by Ettefagh, Aron Baba, MD for IDDM   Copy of this note sent to Pcp, No

## 2022-01-12 NOTE — Patient Instructions (Signed)
Tresiba 66 units DAILY Mounjaro 15 mg WEEKLY (Saturday)

## 2022-01-18 ENCOUNTER — Encounter (HOSPITAL_BASED_OUTPATIENT_CLINIC_OR_DEPARTMENT_OTHER): Payer: Self-pay

## 2022-01-18 ENCOUNTER — Encounter (INDEPENDENT_AMBULATORY_CARE_PROVIDER_SITE_OTHER): Payer: Self-pay | Admitting: Pharmacist

## 2022-01-18 ENCOUNTER — Other Ambulatory Visit (HOSPITAL_BASED_OUTPATIENT_CLINIC_OR_DEPARTMENT_OTHER): Payer: Self-pay

## 2022-01-20 ENCOUNTER — Ambulatory Visit (INDEPENDENT_AMBULATORY_CARE_PROVIDER_SITE_OTHER): Payer: Medicaid Other | Admitting: Pharmacist

## 2022-01-20 ENCOUNTER — Other Ambulatory Visit (HOSPITAL_BASED_OUTPATIENT_CLINIC_OR_DEPARTMENT_OTHER): Payer: Self-pay

## 2022-01-20 ENCOUNTER — Encounter (INDEPENDENT_AMBULATORY_CARE_PROVIDER_SITE_OTHER): Payer: Self-pay | Admitting: Pharmacist

## 2022-01-20 DIAGNOSIS — E119 Type 2 diabetes mellitus without complications: Secondary | ICD-10-CM

## 2022-01-20 DIAGNOSIS — Z794 Long term (current) use of insulin: Secondary | ICD-10-CM

## 2022-01-20 MED ORDER — ONDANSETRON HCL 8 MG PO TABS
8.0000 mg | ORAL_TABLET | Freq: Three times a day (TID) | ORAL | 0 refills | Status: DC | PRN
  Filled 2022-01-20: qty 20, 7d supply, fill #0

## 2022-01-20 MED ORDER — MOUNJARO 12.5 MG/0.5ML ~~LOC~~ SOAJ
12.5000 mg | SUBCUTANEOUS | 3 refills | Status: DC
  Filled 2022-01-20: qty 2, 28d supply, fill #0
  Filled 2022-02-12: qty 2, 28d supply, fill #1
  Filled 2022-03-11: qty 2, 28d supply, fill #2
  Filled 2022-04-09: qty 2, 28d supply, fill #3

## 2022-01-20 NOTE — Progress Notes (Addendum)
This is a Pediatric Specialist virtual follow up consult provided via telephone. Holly [name] consented to an telephone visit consult today.  Location of patient: Holly [name]  is at home. Location of provider: Zachery Conch, PharmD, BCACP, CDCES, CPP is at office.   I connected with Lance Sell on 01/20/2022 by telephone and verified that I am speaking with the correct person using two identifiers. Patient reports she has not taken Mounjaro in 2 weeks; was previously on 15 mg. She states she previously would transition between 15 mg and 12.5 mg pens when she could not get 15 mg, but reports she no longer had any extra 12.5 mg. She previously has taken Zofran for vertigo and reports she tolerated it well.  DM medications Basal insulin: Tresiba 200 units/mL 66 units once daily  GLP-1 agonist: Mounjaro 15 mg subQ once weekly (last increased 11/13/21; Fridays)  Dexcom G6 CGM Report     Assessment TIR is not at goal > 70%, but has improved. No hypoglycemia. She has not been able to get Mounjaro 15 mg from the pharmacy.  She is still experiencing hyperglycemia (BG > 200 mg/dL throughout entire day and most of the night). Contacted pharmacy staff member; pharmacy staff member reports that every strength of Greggory Keen can be ordered except 15 mg). Will decrease Mounjaro 15 mg --> 12.5 mg subQ once weekly due to backorder and patient has been off of Mounjaro for 2 weeks (likely would experience GI upset). Will also initiate Zofran 8 mg Q8H prn for N/V when restarting Mounjaro (pt has been on Zofran 8 mg previously and tolerated well). Will continue Tresiba 200/mL 66 units daily. Patient is interested in re-starting metformin to decrease insulin demands; will restart after she is tolerating Mounjaro to prevent GI upset. Continue wearing Dexcom G6 CGM. Follow up myself in 1 week and  Dr. Vanessa [place] on October 24th 2023.  Plan Continue Tresiba 200/mL 66 units daily  Decrease  Mounjaro 15 mg subQ once weekly --> 12.5 mg subQ once weekly  Dose decrease since she has not taken Mounjaro for >2 weeks Will stay on 12.5 mg for a few months considering backorder of 15 mg strength Initiate Zofran 8 mg Q8H prn for N/V when restarting Mounjaro  Continue Dexcom G6 CGM  Follow up: 1 week telephone call with myself  This appointment required 20 minutes of patient care (this includes precharting, chart review, review of results, virtual care, etc.).  Time spent since 12/26/21 - 01/25/22: 20 minutes  -01/20/22: 20 minutes (billed no charge)  Thank you for involving clinical pharmacist/diabetes educator to assist in providing this patient's care.   Zachery Conch, PharmD, BCACP, CDCES, CPP  I have reviewed the following documentation and I am in agreement with the plan. I was immediately available to the clinical pharmacist for questions and collaboration.  Dessa Phi, MD

## 2022-01-27 ENCOUNTER — Ambulatory Visit (INDEPENDENT_AMBULATORY_CARE_PROVIDER_SITE_OTHER): Payer: Medicaid Other | Admitting: Pharmacist

## 2022-01-27 ENCOUNTER — Encounter (INDEPENDENT_AMBULATORY_CARE_PROVIDER_SITE_OTHER): Payer: Self-pay

## 2022-02-08 ENCOUNTER — Telehealth (INDEPENDENT_AMBULATORY_CARE_PROVIDER_SITE_OTHER): Payer: Self-pay

## 2022-02-08 NOTE — Telephone Encounter (Signed)
Received fax from covermymeds stating that PA is about to expire for [facility] G6 Receiver. Initiated PA on covermymeds:

## 2022-02-09 NOTE — Telephone Encounter (Signed)
Dexcom Receiver approved Coverage Ends on: 02/08/2023

## 2022-02-12 ENCOUNTER — Other Ambulatory Visit (INDEPENDENT_AMBULATORY_CARE_PROVIDER_SITE_OTHER): Payer: Self-pay | Admitting: Pediatric Endocrinology

## 2022-02-12 DIAGNOSIS — E119 Type 2 diabetes mellitus without complications: Secondary | ICD-10-CM

## 2022-02-15 ENCOUNTER — Other Ambulatory Visit (HOSPITAL_BASED_OUTPATIENT_CLINIC_OR_DEPARTMENT_OTHER): Payer: Self-pay

## 2022-02-15 ENCOUNTER — Encounter (INDEPENDENT_AMBULATORY_CARE_PROVIDER_SITE_OTHER): Payer: Self-pay | Admitting: Pharmacist

## 2022-02-15 ENCOUNTER — Encounter (HOSPITAL_BASED_OUTPATIENT_CLINIC_OR_DEPARTMENT_OTHER): Payer: Self-pay

## 2022-02-15 DIAGNOSIS — Z794 Long term (current) use of insulin: Secondary | ICD-10-CM

## 2022-02-15 MED ORDER — DEXCOM G6 SENSOR MISC
5 refills | Status: DC
  Filled 2022-02-15: qty 3, 30d supply, fill #0
  Filled 2022-03-11: qty 3, 30d supply, fill #1
  Filled 2022-04-12: qty 3, 30d supply, fill #2
  Filled 2022-05-10: qty 3, 30d supply, fill #3
  Filled 2022-06-14: qty 3, 30d supply, fill #4
  Filled 2022-07-10: qty 3, 30d supply, fill #5

## 2022-02-15 MED ORDER — FLUCONAZOLE 150 MG PO TABS
150.0000 mg | ORAL_TABLET | ORAL | 0 refills | Status: DC | PRN
  Filled 2022-02-15: qty 5, 5d supply, fill #0

## 2022-02-16 ENCOUNTER — Other Ambulatory Visit (INDEPENDENT_AMBULATORY_CARE_PROVIDER_SITE_OTHER): Payer: Self-pay | Admitting: Pediatric Endocrinology

## 2022-02-16 ENCOUNTER — Other Ambulatory Visit (HOSPITAL_BASED_OUTPATIENT_CLINIC_OR_DEPARTMENT_OTHER): Payer: Self-pay

## 2022-02-16 ENCOUNTER — Other Ambulatory Visit (INDEPENDENT_AMBULATORY_CARE_PROVIDER_SITE_OTHER): Payer: Self-pay

## 2022-02-16 DIAGNOSIS — E119 Type 2 diabetes mellitus without complications: Secondary | ICD-10-CM

## 2022-02-16 DIAGNOSIS — E669 Obesity, unspecified: Secondary | ICD-10-CM

## 2022-02-16 DIAGNOSIS — E1165 Type 2 diabetes mellitus with hyperglycemia: Secondary | ICD-10-CM

## 2022-02-16 MED ORDER — ACCU-CHEK SOFTCLIX LANCETS MISC
5 refills | Status: DC
  Filled 2022-02-16: qty 100, 17d supply, fill #0
  Filled 2022-03-15: qty 100, 17d supply, fill #1
  Filled 2022-04-09: qty 100, 17d supply, fill #2
  Filled 2022-06-03: qty 100, 17d supply, fill #3
  Filled 2022-06-14 – 2022-07-10 (×2): qty 100, 17d supply, fill #4
  Filled 2022-08-03 – 2022-08-04 (×3): qty 100, 17d supply, fill #5
  Filled 2022-08-30: qty 100, 17d supply, fill #6
  Filled 2022-09-26 – 2022-11-01 (×2): qty 100, 17d supply, fill #7
  Filled 2022-11-14: qty 100, 17d supply, fill #8
  Filled 2022-12-01: qty 100, 17d supply, fill #9
  Filled 2023-01-02: qty 100, 17d supply, fill #10
  Filled 2023-02-01: qty 100, 17d supply, fill #11

## 2022-02-16 MED ORDER — ACCU-CHEK GUIDE VI STRP
ORAL_STRIP | 5 refills | Status: DC
  Filled 2022-02-16: qty 200, 34d supply, fill #0
  Filled 2022-03-11 (×2): qty 200, 34d supply, fill #1
  Filled 2022-03-15: qty 150, 25d supply, fill #1
  Filled 2022-04-09: qty 150, 25d supply, fill #2
  Filled 2022-05-07 (×2): qty 150, 25d supply, fill #3
  Filled 2022-06-03: qty 150, 25d supply, fill #4
  Filled 2022-07-10: qty 150, 25d supply, fill #5
  Filled 2022-08-03: qty 200, 33d supply, fill #6
  Filled 2022-08-04: qty 200, 30d supply, fill #6
  Filled 2022-08-04 – 2022-08-30 (×2): qty 150, 25d supply, fill #6
  Filled 2022-09-26: qty 100, 16d supply, fill #7
  Filled 2022-10-22: qty 100, 17d supply, fill #7

## 2022-02-16 MED ORDER — ACCU-CHEK GUIDE W/DEVICE KIT
PACK | 0 refills | Status: DC
  Filled 2022-02-16: qty 1, 30d supply, fill #0

## 2022-03-11 ENCOUNTER — Other Ambulatory Visit (HOSPITAL_BASED_OUTPATIENT_CLINIC_OR_DEPARTMENT_OTHER): Payer: Self-pay

## 2022-03-11 ENCOUNTER — Encounter (HOSPITAL_BASED_OUTPATIENT_CLINIC_OR_DEPARTMENT_OTHER): Payer: Self-pay | Admitting: Pharmacist

## 2022-03-15 ENCOUNTER — Other Ambulatory Visit (HOSPITAL_BASED_OUTPATIENT_CLINIC_OR_DEPARTMENT_OTHER): Payer: Self-pay

## 2022-03-31 LAB — HM DIABETES EYE EXAM

## 2022-04-09 ENCOUNTER — Other Ambulatory Visit (HOSPITAL_BASED_OUTPATIENT_CLINIC_OR_DEPARTMENT_OTHER): Payer: Self-pay

## 2022-04-12 ENCOUNTER — Other Ambulatory Visit (HOSPITAL_BASED_OUTPATIENT_CLINIC_OR_DEPARTMENT_OTHER): Payer: Self-pay

## 2022-04-20 ENCOUNTER — Encounter (INDEPENDENT_AMBULATORY_CARE_PROVIDER_SITE_OTHER): Payer: Self-pay | Admitting: Pediatric Endocrinology

## 2022-04-20 ENCOUNTER — Ambulatory Visit (INDEPENDENT_AMBULATORY_CARE_PROVIDER_SITE_OTHER): Payer: Medicaid Other | Admitting: Pediatric Endocrinology

## 2022-04-20 VITALS — BP 110/68 | HR 112 | Wt 171.2 lb

## 2022-04-20 DIAGNOSIS — Z794 Long term (current) use of insulin: Secondary | ICD-10-CM | POA: Diagnosis not present

## 2022-04-20 DIAGNOSIS — E119 Type 2 diabetes mellitus without complications: Secondary | ICD-10-CM

## 2022-04-20 DIAGNOSIS — Z7985 Long-term (current) use of injectable non-insulin antidiabetic drugs: Secondary | ICD-10-CM | POA: Diagnosis not present

## 2022-04-20 LAB — POCT GLYCOSYLATED HEMOGLOBIN (HGB A1C): Hemoglobin A1C: 7.1 % — AB (ref 4.0–5.6)

## 2022-04-20 LAB — POCT GLUCOSE (DEVICE FOR HOME USE): Glucose Fasting, POC: 152 mg/dL — AB (ref 70–99)

## 2022-04-20 NOTE — Patient Instructions (Addendum)
Increase Tresiba to 70 units   Will further discuss transition to adult care at next visit.

## 2022-04-20 NOTE — Progress Notes (Signed)
Subjective:  Subjective  Patient Name: Holly [name] Date of Birth: 04-Jan-2001  MRN: [id number]  Holly [name]  presents to the office today for follow up evaluation and management of her  diabetes  HISTORY OF PRESENT ILLNESS:   Holly [name] is a 21 y.o. Hispanic female   Holly [name] was unaccompanied  1. Holly [name] was seen in pediatric clinic in June 2018 for her 16 year [place]. At that visit they obtained labs which revealed a hemoglobin a1c of 12%. Her PCP called her to go to the ER for evaluation but family was in [place] for vacation. She went to [facility] and had her initial evaluation there. She was antibody negative for GAD, Islet Cell and Insulin antibodies.  She was started on Lantus with sliding scale only Novolog. She was instructed to limit carbs to 60 grams per meal.  She transferred care to our clinic in July 2018 after returning from [place].   2. Holly [name] was last seen in pediatric endocrine clinic on 01/12/22 . In the interim she has been generally healthy.   She is currently taking Tresiba 66 units and Mounjaro 12.5 mg. She was previously on 15 mg of Mounjaro but there were supply issues and she had to reduce her dose.   She feels that overall her sugars are better.   At her last visit she was taking her [place] about 5 nights a week. She says that she is doing better with that at this time. She says that she decided to "stop being lazy".   Tresiba 66 units Mounjaro Dose 12.5 mg on Thursdays  She is complaining of foul tasting/smelling burps. She is drinking Premiere Protein shakes for breakfast and feels that even her urine smells like the drink. She also likes probiotic yogurt drinks. She feels that otherwise she is drinking water.     3. Pertinent Review of Systems:  Constitutional: The patient feels "pretty good". The patient seems healthy and active.  Eyes: Vision seems to be good. There are no recognized eye problems.  Neck: The  patient has no complaints of anterior neck swelling, soreness, tenderness, pressure, discomfort, or difficulty swallowing.   Heart: Heart rate increases with exercise or other physical activity. The patient has no complaints of palpitations, irregular heart beats, chest pain, or chest pressure.   Lungs: no asthma or wheezing Gastrointestinal: Bowel movents seem normal. The patient has no complaints of excessive hunger, acid reflux, upset stomach, stomach aches or pains, diarrhea, or constipation.  Legs: Muscle mass and strength seem normal. There are no complaints of numbness, tingling, burning, or pain. No edema is noted.  Feet: There are no obvious foot problems. There are no complaints of numbness, tingling, burning, or pain. No edema is noted. Neurologic: There are no recognized problems with muscle movement and strength, sensation, or coordination. GYN/GU:  LMP 04/02/22. Regular. Short duration of flow Skin: mild acne. Stretch marks.   Diabetes ID: None- does have it on her phone   Injection sites: stomach.   Annual Labs - Done April 2023- no issues    Dexcom CGM:                   PAST MEDICAL, FAMILY, AND SOCIAL HISTORY  Past Medical History:  Diagnosis Date   Diabetes mellitus without complication ([place])    Mild acne 12/04/2013   Obesity, unspecified 12/04/2013    Family History  Problem Relation Age of Onset   Diabetes Mother    Mom and maternal uncles with  type 2 diabetes.   Current Outpatient Medications:    insulin degludec (TRESIBA FLEXTOUCH) 200 UNIT/ML FlexTouch Pen, Inject up to 120 units daily as directed by physician, Disp: 30 mL, Rfl: 11   tirzepatide (MOUNJARO) 12.5 MG/0.5ML Pen, Inject 12.5 mg into the skin once a week., Disp: 2 mL, Rfl: 3   Accu-Chek Softclix Lancets lancets, Use to check sugar 6 times daily. (Patient not taking: Reported on 04/20/2022), Disp: 200 each, Rfl: 5   Ascorbic Acid (VITAMIN C ADULT GUMMIES PO), Take by mouth. (Patient not  taking: Reported on 12/03/2020), Disp: , Rfl:    BD PEN NEEDLE NANO 2ND GEN 32G X 4 MM MISC, USE TO INJECT INSULIN 6 TIMES DAILY (Patient not taking: Reported on 01/12/2022), Disp: 200 each, Rfl: 5   Blood Glucose Monitoring Suppl (ACCU-CHEK GUIDE) w/Device KIT, Use to check sugars up to 6 times daily. (Patient not taking: Reported on 04/20/2022), Disp: 1 kit, Rfl: 0   Clindamycin-Benzoyl Per, Refr, gel, Apply 1 application topically 2 (two) times daily. (Patient not taking: Reported on 01/12/2022), Disp: 45 g, Rfl: 1   Continuous Blood Gluc Receiver ([place]) [place], 1 each by Does not apply route as directed. (Patient not taking: Reported on 01/12/2022), Disp: 1 each, Rfl: 1   Continuous Blood Gluc Sensor (DEXCOM G6 SENSOR) MISC, USE 1 SENSOR EVERY 10 DAYS AS DIRECTED (Patient not taking: Reported on 04/20/2022), Disp: 3 each, Rfl: 5   Continuous Blood Gluc Transmit (DEXCOM G6 TRANSMITTER) MISC, 1 each by Does not apply route every 3 (three) months. (Patient not taking: Reported on 04/20/2022), Disp: 1 each, Rfl: 3   Continuous Blood Gluc Transmit (DEXCOM G6 TRANSMITTER) MISC, 1 EACH BUT DOES NOT APPLY ROUTE EVERY 3 MONTHS. (Patient not taking: Reported on 01/12/2022), Disp: 1 each, Rfl: 3   dimenhyDRINATE (DRAMAMINE) 50 MG tablet, Take by mouth every 8 (eight) hours as needed. (Patient not taking: Reported on 06/04/2021), Disp: , Rfl:    fluconazole (DIFLUCAN) 150 MG tablet, Take 1 tablet (150 mg total) by mouth as needed. (Patient not taking: Reported on 04/20/2022), Disp: 5 tablet, Rfl: 0   Glucagon (BAQSIMI TWO PACK) 3 MG/DOSE POWD, Place 1 each into the nose as needed (severe hypoglycmia with unresponsiveness). (Patient not taking: Reported on 01/24/2020), Disp: 1 each, Rfl: 3   glucose blood (ACCU-CHEK GUIDE) test strip, Use as directed 6 times daily. (Patient not taking: Reported on 04/20/2022), Disp: 200 each, Rfl: 5   hydrOXYzine (ATARAX/VISTARIL) 10 MG tablet, Take 1 tablet (10 mg total) by  mouth 3 (three) times daily as needed. (Patient not taking: Reported on 01/12/2022), Disp: 60 tablet, Rfl: 2   insulin degludec (TRESIBA FLEXTOUCH) 200 UNIT/ML FlexTouch Pen, ADMINISTER UP TO 120 UNITS UNDER THE SKIN EVERY DAY AS DIRECTED BY PRESCRIBER (Patient not taking: Reported on 01/12/2022), Disp: 30 mL, Rfl: 11   meclizine (ANTIVERT) 25 MG tablet, Take 1 tablet (25 mg total) by mouth 3 (three) times daily as needed for dizziness. (Patient not taking: Reported on 01/12/2022), Disp: 30 tablet, Rfl: 0   metFORMIN (GLUCOPHAGE-XR) 750 MG 24 hr tablet, Take 1 tablet (750 mg total) by mouth in the morning and at bedtime. (Patient not taking: Reported on 09/29/2021), Disp: 60 tablet, Rfl: 5   NOVOLOG FLEXPEN 100 UNIT/ML FlexPen, INJECT UP TO 100 UNITS UNDER THE SKIN DAILY PER DIABETES CARE PLAN (Patient not taking: Reported on 12/03/2020), Disp: 30 mL, Rfl: 5   ondansetron (ZOFRAN) 8 MG tablet, Take 1 tablet (8 mg  total) by mouth every 8 (eight) hours as needed for nausea or vomiting. (Patient not taking: Reported on 04/20/2022), Disp: 20 tablet, Rfl: 0   tirzepatide (MOUNJARO) 15 MG/0.5ML Pen, Inject 15 mg into the skin once a week. (Patient not taking: Reported on 04/20/2022), Disp: 2 mL, Rfl: 5  Allergies as of 04/20/2022   (No Known Allergies)     reports that she has never smoked. She has never used smokeless tobacco. She reports that she does not drink alcohol and does not use drugs. Pediatric History  Patient Parents   Cervantes,Vitalina (Mother)   Cabrera,Arturo (Father)   Other Topics Concern   Not on file  Social History Narrative   Graduated from [employer]. Currently working at [employer].     1. School and Family:  Lives with parents and sister. Eye lashes and nails. 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 Activities: She is not currently exercising outside of work.  3. Primary Care Provider: Pcp, No  ROS: There are no other significant problems involving [place]'s other body systems.     Objective:  Objective   Vital Signs:    BP 110/68   Pulse (!) 112   Wt 171 lb 3.2 oz (77.7 kg)   BMI 30.33 kg/m   Growth %ile SmartLinks can only be used for patients less than 48 years old.  Ht Readings from Last 3 Encounters:  09/29/21 5' 2.99" (1.6 m) (30 %, Z= -0.51)*  05/29/20 _0  (1.6 m) (31 %, Z= -0.49)*  04/08/20 _1  (1.575 m) (19 %, Z= -0.88)*   * Growth percentiles are based on CDC (Girls, 2-20 Years) data.   Wt Readings from Last 3 Encounters:  04/20/22 171 lb 3.2 oz (77.7 kg)  01/12/22 164 lb 11.2 oz (74.7 kg)  09/29/21 162 lb 3.2 oz (73.6 kg) (88 %, Z= 1.19)*   * Growth percentiles are based on CDC (Girls, 2-20 Years) data.   HC Readings from Last 3 Encounters:  No data found for [facility]   Body surface area is 1.86 meters squared.  Facility age limit for growth %iles is 20 years. Facility age limit for growth %iles is 20 years. Constitutional: The patient appears healthy and well nourished. The patient's height and weight are normal for age. She has lost 2 pounds since last visit  Head: The head is normocephalic. Face: The face appears normal. There are no obvious dysmorphic features. Eyes: The eyes appear to be normally formed and spaced. Gaze is conjugate. There is no obvious arcus or proptosis. Moisture appears normal. Ears: The ears are normally placed and appear externally normal. Mouth: The oropharynx and tongue appear normal. Dentition appears to be normal for age. Oral moisture is normal. Neck: The neck appears to be visibly normal. The consistency of the thyroid gland is normal. The thyroid gland is not tender to palpation. Lungs: No increased work of breathing. CTA.  Heart: Normal pulses and peripheral perfusion. RRR S1S2 Abdomen: The abdomen appears to be normal in size for the patient's age. . There is no obvious hepatomegaly, splenomegaly, or other mass effect.  Arms: Muscle size and bulk are normal for age. Hands: There is no obvious tremor.  Phalangeal and metacarpophalangeal joints are normal. Palmar muscles are normal for age. Palmar skin is normal. Palmar moisture is also normal. Legs: Muscles appear normal for age. No edema is present. Feet: Feet are normally formed. Dorsalis pedal pulses are normal. Neurologic: Strength is normal for age in both the upper and  lower extremities. Muscle tone is normal. Sensation to touch is normal in both the legs and feet.   GYN/GU: normal female  Skin: moderate facial acne. No acanthosis noted. Has green discoloration around neck from necklace  LAB DATA:     Lab Results  Component Value Date   HGBA1C 7.1 (A) 04/20/2022   HGBA1C 7.2 (A) 01/12/2022   HGBA1C 8.4 (H) 09/29/2021   HGBA1C 8.5 (A) 06/04/2021   HGBA1C 8.1 (A) 03/05/2021   HGBA1C 7.9 (A) 12/03/2020   HGBA1C 8.8 (H) 09/02/2020   HGBA1C 8.2 (A) 05/29/2020     Results for orders placed or performed in visit on 04/20/22  POCT glycosylated hemoglobin (Hb A1C)  Result Value Ref Range   Hemoglobin A1C 7.1 (A) 4.0 - 5.6 %   HbA1c POC (<> result, manual entry)     HbA1c, POC (prediabetic range)     HbA1c, POC (controlled diabetic range)    POCT Glucose (Device for Home Use)  Result Value Ref Range   Glucose Fasting, POC 152 (A) 70 - 99 mg/dL   POC Glucose        Assessment and Plan:  Assessment  ASSESSMENT: Dariel is a 21 y.o. 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 She was diagnosed at [facility] and was antibody negative based on records.   Diabetes     - POC CBG as above - A1C POC as above is not at goal (<6.5%). However, it is stable.  - Still taking Tyler Aas- and has improved her adherence - Tresiba 66 units. - increase to 70 units - Now taking Mounjaro 12.5 mg - Dexcom CGM Reviewed report in detail with patient and used this data to adjust Tresiba dose  - She continues to feel good on the [facility]. She is no longer drinking pineapple juice. She is having issues with gas.   PLAN:     1.  Diagnostic: Lab Orders         POCT glycosylated hemoglobin (Hb A1C)         POCT Glucose (Device for Home Use)     2. Therapeutic: Medications as above. Increase Tresiba to 70 units. Continue Mounjaro 12.5 mg.  3. Patient education:  All discussion as above.  4. Follow-up: Return in about 3 months (around 07/21/2022).       Lelon Huh, MD  Level of Service: >30 minutes spent today reviewing the medical chart, counseling the patient/family, and documenting today's encounter.   When a patient is on insulin, intensive monitoring of blood glucose levels is necessary to avoid hyperglycemia and hypoglycemia. Severe hyperglycemia/hypoglycemia can lead to hospital admissions and be life threatening.    Patient referred by No ref. provider found for IDDM   Copy of this note sent to Pcp, No

## 2022-05-07 ENCOUNTER — Other Ambulatory Visit (HOSPITAL_BASED_OUTPATIENT_CLINIC_OR_DEPARTMENT_OTHER): Payer: Self-pay

## 2022-05-10 ENCOUNTER — Other Ambulatory Visit (INDEPENDENT_AMBULATORY_CARE_PROVIDER_SITE_OTHER): Payer: Self-pay | Admitting: Pediatric Endocrinology

## 2022-05-10 ENCOUNTER — Other Ambulatory Visit (HOSPITAL_BASED_OUTPATIENT_CLINIC_OR_DEPARTMENT_OTHER): Payer: Self-pay

## 2022-05-10 DIAGNOSIS — E119 Type 2 diabetes mellitus without complications: Secondary | ICD-10-CM

## 2022-05-10 MED ORDER — MOUNJARO 12.5 MG/0.5ML ~~LOC~~ SOAJ
12.5000 mg | SUBCUTANEOUS | 3 refills | Status: DC
  Filled 2022-05-10: qty 2, 28d supply, fill #0
  Filled 2022-06-03: qty 2, 28d supply, fill #1
  Filled 2022-07-10: qty 2, 28d supply, fill #2
  Filled 2022-08-03: qty 2, 28d supply, fill #3

## 2022-06-04 ENCOUNTER — Other Ambulatory Visit: Payer: Self-pay

## 2022-06-14 ENCOUNTER — Other Ambulatory Visit (HOSPITAL_BASED_OUTPATIENT_CLINIC_OR_DEPARTMENT_OTHER): Payer: Self-pay

## 2022-06-18 ENCOUNTER — Encounter (INDEPENDENT_AMBULATORY_CARE_PROVIDER_SITE_OTHER): Payer: Self-pay | Admitting: Pediatric Endocrinology

## 2022-06-18 DIAGNOSIS — L732 Hidradenitis suppurativa: Secondary | ICD-10-CM

## 2022-06-19 ENCOUNTER — Emergency Department (HOSPITAL_BASED_OUTPATIENT_CLINIC_OR_DEPARTMENT_OTHER)
Admission: EM | Admit: 2022-06-19 | Discharge: 2022-06-19 | Disposition: A | Discharge status: Home / Self Care | Payer: Medicaid Other | Attending: Emergency Medicine | Admitting: Emergency Medicine

## 2022-06-19 ENCOUNTER — Encounter (HOSPITAL_BASED_OUTPATIENT_CLINIC_OR_DEPARTMENT_OTHER): Payer: Self-pay | Admitting: Emergency Medicine

## 2022-06-19 DIAGNOSIS — Z7984 Long term (current) use of oral hypoglycemic drugs: Secondary | ICD-10-CM | POA: Diagnosis not present

## 2022-06-19 DIAGNOSIS — L02419 Cutaneous abscess of limb, unspecified: Secondary | ICD-10-CM | POA: Insufficient documentation

## 2022-06-19 DIAGNOSIS — Z794 Long term (current) use of insulin: Secondary | ICD-10-CM | POA: Diagnosis not present

## 2022-06-19 MED ORDER — DOXYCYCLINE HYCLATE 100 MG PO CAPS: 100.0000 mg | ORAL_CAPSULE | Freq: Two times a day (BID) | ORAL | 0 refills | Status: DC

## 2022-06-19 MED ORDER — LIDOCAINE-EPINEPHRINE-TETRACAINE (LET) TOPICAL GEL
3.0000 mL | Freq: Once | TOPICAL | Status: AC
  Administered 2022-06-19: 3 mL via TOPICAL
  Filled 2022-06-19: qty 3

## 2022-06-19 NOTE — ED Triage Notes (Signed)
Patient presents with abscess to L axilla. Onset 2-3 days ago. Actively draining in triage.

## 2022-06-19 NOTE — ED Provider Notes (Signed)
[facility] EMERGENCY DEPT Provider Note   CSN: [id number] Arrival date & time: 06/19/22  1147     History  Chief Complaint  Patient presents with   Abscess    Holly [name] is a 21 y.o. female  The history is provided by the patient. No language interpreter was used.  Abscess Abscess location: Left axilla. Abscess quality: draining, fluctuance, induration, painful, redness and warmth   Red streaking: no   Duration:  3 days Progression:  Worsening Pain details:    Quality:  Aching, burning and pressure   Severity:  Severe   Timing:  Constant   Progression:  Worsening Chronicity:  Recurrent Context: diabetes   Context comment:  HS Relieved by:  Nothing Worsened by:  Nothing Ineffective treatments:  None tried Associated symptoms: no fever, no headaches, no nausea and no vomiting   Risk factors: prior abscess        Home Medications Prior to Admission medications   Medication Sig Start Date End Date Taking? Authorizing Provider  Accu-Chek Softclix Lancets lancets Use to check sugar 6 times daily. Patient not taking: Reported on 04/20/2022 02/16/22   Lelon Huh, MD  Ascorbic Acid (VITAMIN C ADULT GUMMIES PO) Take by mouth. Patient not taking: Reported on 12/03/2020    [provider]  BD PEN NEEDLE NANO 2ND GEN 32G X 4 MM MISC USE TO INJECT INSULIN 6 TIMES DAILY Patient not taking: Reported on 01/12/2022 01/19/21   Lelon Huh, MD  Blood Glucose Monitoring Suppl (ACCU-CHEK GUIDE) w/Device KIT Use to check sugars up to 6 times daily. Patient not taking: Reported on 04/20/2022 02/16/22   Lelon Huh, MD  Clindamycin-Benzoyl Per, Refr, gel Apply 1 application topically 2 (two) times daily. Patient not taking: Reported on 01/12/2022 12/03/20   Lelon Huh, MD  Continuous Blood Gluc Receiver ([place]) [place] 1 each by Does not apply route as directed. Patient not taking: Reported on 01/12/2022 09/02/20   Lelon Huh,  MD  Continuous Blood Gluc Sensor (DEXCOM G6 SENSOR) MISC USE 1 SENSOR EVERY 10 DAYS AS DIRECTED Patient not taking: Reported on 04/20/2022 02/15/22   Lelon Huh, MD  Continuous Blood Gluc Transmit (DEXCOM G6 TRANSMITTER) MISC 1 each by Does not apply route every 3 (three) months. Patient not taking: Reported on 04/20/2022 12/08/21   Lelon Huh, MD  Continuous Blood Gluc Transmit (DEXCOM G6 TRANSMITTER) MISC 1 EACH BUT DOES NOT APPLY ROUTE EVERY 3 MONTHS. Patient not taking: Reported on 01/12/2022 12/08/21   Lelon Huh, MD  dimenhyDRINATE (DRAMAMINE) 50 MG tablet Take by mouth every 8 (eight) hours as needed. Patient not taking: Reported on 06/04/2021    [provider]  doxycycline (VIBRAMYCIN) 100 MG capsule Take 1 capsule (100 mg total) by mouth 2 (two) times daily. One po bid x 7 days 06/19/22   Margarita Mail, PA-C  fluconazole (DIFLUCAN) 150 MG tablet Take 1 tablet (150 mg total) by mouth as needed. Patient not taking: Reported on 04/20/2022 02/15/22   Lelon Huh, MD  Glucagon (BAQSIMI TWO PACK) 3 MG/DOSE POWD Place 1 each into the nose as needed (severe hypoglycmia with unresponsiveness). Patient not taking: Reported on 01/24/2020 08/08/18   Lelon Huh, MD  glucose blood (ACCU-CHEK GUIDE) test strip Use as directed 6 times daily. Patient not taking: Reported on 04/20/2022 02/16/22   Lelon Huh, MD  hydrOXYzine (ATARAX/VISTARIL) 10 MG tablet Take 1 tablet (10 mg total) by mouth 3 (three) times daily as needed. Patient not taking: Reported on 01/12/2022 10/18/19  Lelon Huh, MD  insulin degludec (TRESIBA FLEXTOUCH) 200 UNIT/ML FlexTouch Pen Inject up to 120 units daily as directed by physician 11/24/21   Lelon Huh, MD  insulin degludec (TRESIBA FLEXTOUCH) 200 UNIT/ML FlexTouch Pen ADMINISTER UP TO 120 UNITS UNDER THE SKIN EVERY DAY AS DIRECTED BY PRESCRIBER Patient not taking: Reported on 01/12/2022 12/08/21   Lelon Huh, MD  meclizine (ANTIVERT) 25  MG tablet Take 1 tablet (25 mg total) by mouth 3 (three) times daily as needed for dizziness. Patient not taking: Reported on 01/12/2022 04/09/20   Larene Pickett, PA-C  metFORMIN (GLUCOPHAGE-XR) 750 MG 24 hr tablet Take 1 tablet (750 mg total) by mouth in the morning and at bedtime. Patient not taking: Reported on 09/29/2021 03/17/21   Lelon Huh, MD  NOVOLOG FLEXPEN 100 UNIT/ML FlexPen INJECT UP TO 100 UNITS UNDER THE SKIN DAILY PER DIABETES CARE PLAN Patient not taking: Reported on 12/03/2020 05/30/20   Lelon Huh, MD  ondansetron (ZOFRAN) 8 MG tablet Take 1 tablet (8 mg total) by mouth every 8 (eight) hours as needed for nausea or vomiting. Patient not taking: Reported on 04/20/2022 01/20/22   Lelon Huh, MD  tirzepatide [facility]) 12.5 MG/0.5ML Pen Inject 12.5 mg into the skin once a week. 05/10/22   Lelon Huh, MD  tirzepatide [facility]) 15 MG/0.5ML Pen Inject 15 mg into the skin once a week. Patient not taking: Reported on 04/20/2022 11/24/21   Lelon Huh, MD      Allergies    Patient has no known allergies.    Review of Systems   Review of Systems  Constitutional:  Negative for fever.  Gastrointestinal:  Negative for nausea and vomiting.  Neurological:  Negative for headaches.    Physical Exam Updated Vital Signs BP (!) 150/91 (BP Location: Right Arm)   Pulse 100   Temp 99.1 F (37.3 C) (Oral)   Resp 18   Ht _0  (1.575 m)   Wt 72.6 kg   SpO2 97%   BMI 29.26 kg/m  Physical Exam Vitals and nursing note reviewed.  Constitutional:      General: She is not in acute distress.    Appearance: She is well-developed. She is not diaphoretic.  HENT:     Head: Normocephalic and atraumatic.     Right Ear: External ear normal.     Left Ear: External ear normal.     Nose: Nose normal.     Mouth/Throat:     Mouth: Mucous membranes are moist.  Eyes:     General: No scleral icterus.    Conjunctiva/sclera: Conjunctivae normal.  Cardiovascular:     Rate and  Rhythm: Normal rate and regular rhythm.     Heart sounds: Normal heart sounds. No murmur heard.    No friction rub. No gallop.  Pulmonary:     Effort: Pulmonary effort is normal. No respiratory distress.     Breath sounds: Normal breath sounds.  Abdominal:     General: Bowel sounds are normal. There is no distension.     Palpations: Abdomen is soft. There is no mass.     Tenderness: There is no abdominal tenderness. There is no guarding.  Musculoskeletal:     Cervical back: Normal range of motion.  Skin:    General: Skin is warm and dry.     Comments: L Axilla with actively draining abscess  Neurological:     Mental Status: She is alert and oriented to person, place, and time.  Psychiatric:  Behavior: Behavior normal.     ED Results / Procedures / Treatments   Labs (all labs ordered are listed, but only abnormal results are displayed) Labs Reviewed - No data to display  EKG None  Radiology No results found.  Procedures Ultrasound ED Soft Tissue  Date/Time: 06/19/2022 5:30 PM  Performed by: Margarita Mail, PA-C Authorized by: Margarita Mail, PA-C   Procedure details:    Indications: localization of abscess     Transverse view:  Visualized   Longitudinal view:  Visualized   Images: archived   Location:    Location: axilla     Side:  Left Findings:     abscess present    cellulitis present    no foreign body present Comments:     No deeper cavity of fluid or loculations .Marland KitchenIncision and Drainage  Date/Time: 06/19/2022 5:31 PM  Performed by: Margarita Mail, PA-C Authorized by: Margarita Mail, PA-C   Anesthesia:    Anesthesia method:  Topical application   Topical anesthetic:  LET Procedure details:    Packing materials:  1/4 in iodoform gauze Post-procedure details:    Procedure completion:  Tolerated well, no immediate complications Comments:     Patient with actively draining abscess, bedside ultrasound images acquired and archived.  No  obvious deep space cavity or loculations.  I placed of about 3 inches of iodoform gauze in the opening to the abscess which occurred spontaneously prior to my evaluation.  I did not perform incision and drainage only placed the gauze to maintain and opening.  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  No evidence of surrounding cellulitis.  The cavity is currently open and draining.  Iodoform gauze placed to maintain opening for complete drainage of the abscess.  Patient will be placed on doxycycline.  She is afebrile and hemodynamically stable.  Discussed return precautions and packing removal instructions.  Risk Prescription drug management.           Final Clinical Impression(s) / ED Diagnoses Final diagnoses:  Axillary abscess    Rx / DC Orders ED Discharge Orders          Ordered    doxycycline (VIBRAMYCIN) 100 MG capsule  2 times daily,   Status:  Discontinued        06/19/22 1644    doxycycline (VIBRAMYCIN) 100 MG capsule  2 times daily        06/19/22 1727              Margarita Mail, PA-C 06/19/22 1733    Blanchie Dessert, MD 06/20/22 1545

## 2022-06-19 NOTE — Discharge Instructions (Signed)
Remove the packing from your abscess in 2 to 3 days or you may return to have [place] do it.  Take your antibiotics as prescribed until you have completed it.  Do not drink alcohol with this medication.

## 2022-06-23 MED ORDER — CLINDAMYCIN PHOS-BENZOYL PEROX 1.2-5 % EX GEL: 1.0000 "application " | Freq: Two times a day (BID) | CUTANEOUS | 1 refills | Status: DC

## 2022-06-26 ENCOUNTER — Telehealth (INDEPENDENT_AMBULATORY_CARE_PROVIDER_SITE_OTHER): Payer: Self-pay | Admitting: Pediatrics

## 2022-06-26 ENCOUNTER — Encounter (INDEPENDENT_AMBULATORY_CARE_PROVIDER_SITE_OTHER): Payer: Self-pay | Admitting: Pediatrics

## 2022-06-26 NOTE — Telephone Encounter (Signed)
Received call from patient- she has vomiting/dizziness/chills/nausea starting Thursday night.  She has continued to take her tresiba 70 units daily and mounjaro once weekly (took it Friday after the vomiting on Thursday night).  BGs have been in the 80-210 range.  Not eating much, but still drinking. Also with dizziness, similar though better than prior episode of vertigo.  She has been taking zofran 8mg  1-2 times daily.  No appetite, though drinking throughout the day.  Explained that this is likely a GI virus.  Continue zofran as needed (start with 4mg , then can take an additional 4mg  if no relief 1 hour later); can dose every 8 hours.  Continue drinking to stay hydrated.    She also needs a work excuse; will provide one excusing her from work 06/24/22-06/26/22.  Letter put in her chart that she can access from [place].  06/26/22, MD

## 2022-06-29 ENCOUNTER — Telehealth (INDEPENDENT_AMBULATORY_CARE_PROVIDER_SITE_OTHER): Payer: Self-pay | Admitting: Pediatric Endocrinology

## 2022-06-29 NOTE — Telephone Encounter (Signed)
Returned call to patient regarding symptoms.  Patient is feeling better, got appetite back.  She has diarrhea and stomach cramps now.   She did call her PCP and had an over the phone visit on Saturday.  PCP thought she had stomach flu, BG have been ok.  She has been sick since Wed of last week.  They never got low but were close to getting low.  She then mentioned that she did speak with our on call provider over the weekend Dr. Charna Archer.  I apologized for the call as that information was not passed on in the message.  She was thankful for the follow up and checking in on her.  She will call PCP back If symptoms worsen.

## 2022-06-29 NOTE — Telephone Encounter (Signed)
Who's calling (name and relationship to patient) :  Best contact number: [phone]  Provider they see: [facility]  Reason for call: caller states has been having vertigo, issues with nausea and dizziness.  She has nausea medication but she needs a refill. Needs a doctor note for her job   Call ID: [id number]     [place]  Name of prescription:  Pharmacy:

## 2022-07-10 ENCOUNTER — Other Ambulatory Visit (HOSPITAL_BASED_OUTPATIENT_CLINIC_OR_DEPARTMENT_OTHER): Payer: Self-pay

## 2022-07-12 ENCOUNTER — Other Ambulatory Visit: Payer: Self-pay

## 2022-07-12 ENCOUNTER — Other Ambulatory Visit (HOSPITAL_BASED_OUTPATIENT_CLINIC_OR_DEPARTMENT_OTHER): Payer: Self-pay

## 2022-07-21 ENCOUNTER — Ambulatory Visit (INDEPENDENT_AMBULATORY_CARE_PROVIDER_SITE_OTHER): Payer: Medicaid Other | Admitting: Pediatric Endocrinology

## 2022-08-03 ENCOUNTER — Other Ambulatory Visit (HOSPITAL_BASED_OUTPATIENT_CLINIC_OR_DEPARTMENT_OTHER): Payer: Self-pay

## 2022-08-03 ENCOUNTER — Other Ambulatory Visit (INDEPENDENT_AMBULATORY_CARE_PROVIDER_SITE_OTHER): Payer: Self-pay | Admitting: Pediatric Endocrinology

## 2022-08-03 DIAGNOSIS — Z794 Long term (current) use of insulin: Secondary | ICD-10-CM

## 2022-08-04 ENCOUNTER — Other Ambulatory Visit (HOSPITAL_BASED_OUTPATIENT_CLINIC_OR_DEPARTMENT_OTHER): Payer: Self-pay

## 2022-08-04 ENCOUNTER — Other Ambulatory Visit: Payer: Self-pay

## 2022-08-04 MED ORDER — DEXCOM G6 SENSOR MISC
5 refills | Status: DC
  Filled 2022-08-04: qty 3, 30d supply, fill #0
  Filled 2022-08-30: qty 3, 30d supply, fill #1
  Filled 2022-09-26 – 2022-10-09 (×3): qty 3, 30d supply, fill #2
  Filled 2022-11-01: qty 3, 30d supply, fill #3
  Filled 2022-11-14 – 2022-12-01 (×2): qty 3, 30d supply, fill #4
  Filled 2023-01-02: qty 3, 30d supply, fill #5

## 2022-08-05 ENCOUNTER — Other Ambulatory Visit (HOSPITAL_BASED_OUTPATIENT_CLINIC_OR_DEPARTMENT_OTHER): Payer: Self-pay

## 2022-08-12 ENCOUNTER — Other Ambulatory Visit (HOSPITAL_BASED_OUTPATIENT_CLINIC_OR_DEPARTMENT_OTHER): Payer: Self-pay

## 2022-08-30 ENCOUNTER — Other Ambulatory Visit: Payer: Self-pay

## 2022-08-30 ENCOUNTER — Other Ambulatory Visit (INDEPENDENT_AMBULATORY_CARE_PROVIDER_SITE_OTHER): Payer: Self-pay | Admitting: Pediatric Endocrinology

## 2022-08-30 ENCOUNTER — Other Ambulatory Visit (HOSPITAL_BASED_OUTPATIENT_CLINIC_OR_DEPARTMENT_OTHER): Payer: Self-pay

## 2022-08-30 DIAGNOSIS — E119 Type 2 diabetes mellitus without complications: Secondary | ICD-10-CM

## 2022-08-30 MED ORDER — MOUNJARO 12.5 MG/0.5ML ~~LOC~~ SOAJ
12.5000 mg | SUBCUTANEOUS | 3 refills | Status: DC
  Filled 2022-08-30: qty 2, 28d supply, fill #0
  Filled 2022-09-29: qty 2, 28d supply, fill #1
  Filled 2022-11-01: qty 2, 28d supply, fill #2
  Filled 2022-11-14 – 2022-12-07 (×2): qty 2, 28d supply, fill #3

## 2022-09-02 ENCOUNTER — Other Ambulatory Visit (HOSPITAL_BASED_OUTPATIENT_CLINIC_OR_DEPARTMENT_OTHER): Payer: Self-pay

## 2022-09-26 ENCOUNTER — Encounter (INDEPENDENT_AMBULATORY_CARE_PROVIDER_SITE_OTHER): Payer: Self-pay | Admitting: Pediatric Endocrinology

## 2022-09-26 ENCOUNTER — Other Ambulatory Visit (HOSPITAL_BASED_OUTPATIENT_CLINIC_OR_DEPARTMENT_OTHER): Payer: Self-pay

## 2022-09-26 DIAGNOSIS — E669 Obesity, unspecified: Secondary | ICD-10-CM

## 2022-09-27 MED ORDER — BD PEN NEEDLE NANO 2ND GEN 32G X 4 MM MISC: 5 refills | Status: DC

## 2022-09-29 ENCOUNTER — Other Ambulatory Visit (HOSPITAL_BASED_OUTPATIENT_CLINIC_OR_DEPARTMENT_OTHER): Payer: Self-pay

## 2022-09-30 ENCOUNTER — Other Ambulatory Visit: Payer: Self-pay

## 2022-10-02 ENCOUNTER — Encounter (HOSPITAL_COMMUNITY): Payer: Self-pay

## 2022-10-02 ENCOUNTER — Ambulatory Visit (HOSPITAL_COMMUNITY)
Admission: EM | Admit: 2022-10-02 | Discharge: 2022-10-02 | Disposition: A | Discharge status: Home / Self Care | Payer: Medicaid Other | Attending: Physician Assistant | Admitting: Physician Assistant

## 2022-10-02 DIAGNOSIS — R42 Dizziness and giddiness: Secondary | ICD-10-CM

## 2022-10-02 DIAGNOSIS — R110 Nausea: Secondary | ICD-10-CM

## 2022-10-02 LAB — CBG MONITORING, ED: Glucose-Capillary: 165 mg/dL — ABNORMAL HIGH (ref 70–99)

## 2022-10-02 MED ORDER — ONDANSETRON 4 MG PO TBDP
ORAL_TABLET | ORAL | Status: AC
  Filled 2022-10-02: qty 1

## 2022-10-02 MED ORDER — MECLIZINE HCL 25 MG PO TABS: 25.0000 mg | ORAL_TABLET | Freq: Three times a day (TID) | ORAL | 1 refills | Status: DC | PRN

## 2022-10-02 MED ORDER — ONDANSETRON 4 MG PO TBDP
4.0000 mg | ORAL_TABLET | Freq: Once | ORAL | Status: AC
  Administered 2022-10-02: 4 mg via ORAL

## 2022-10-02 NOTE — ED Triage Notes (Signed)
Pt is here for dizziness, nausea, chills, diarrhea  x few days , pt is a diabetic . Pt has a family  hx of vertigo , pt states two days ago her ears was ringing . Pt stated the dizziness and nausea started this morning . Pt has tried dramamine less drowsy 25mg  at 7:15 am today .

## 2022-10-02 NOTE — Discharge Instructions (Addendum)
Advised to continue to keep a close watch on glucose levels.  Advised to make sure to drink fluids on a regular basis and eat. Advised to consult endocrinologist if needed if glucose levels start having drastic swings.  Advised to take Antivert 25 mg every 6-8 hours as needed for dizziness and nausea.  Advised follow-up PCP return to urgent care as needed.

## 2022-10-02 NOTE — ED Provider Notes (Signed)
[facility]    CSN: [id number] Arrival date & time: 10/02/22  1003      History   Chief Complaint Chief Complaint  Patient presents with   Dizziness   Nausea    HPI Holly [name] is a 22 y.o. female.   22 year old female presents with dizziness and nausea.  Patient indicates that 2 days ago she started having ringing of the ears.  She indicates that she woke up next morning and was trying to get out of bed when she started having an episode of dizziness, off-balance sensation, nausea.  Patient indicates that the dizziness tended to get worse when she would get up and try to walk.  She indicates walking she felt like she was "off balance".  Patient indicates that the dizziness and off-balance will be worse with positional changes.  She indicates she had improved symptoms when she would just sit stationary and would not be moving, symptoms are better when she is sitting still.  Patient indicates she has had vertigo before the last episode was several months ago.  She indicates she is diabetic and she does treat the diabetes with insulin therapy.  Patient indicates that at 730 this morning she checked her glucose and it was 145.  She is without fever, chills, cough or congestion.  She is tolerating fluids well.  Patient indicates that just before symptoms started she was having some mild nausea and diarrhea that only lasted for a few days and resolved.  She has taken Dramamine with minimal improvement of her symptoms.   Dizziness Associated symptoms: nausea     Past Medical History:  Diagnosis Date   Diabetes mellitus without complication    Mild acne 12/04/2013   Obesity, unspecified 12/04/2013    Patient Active Problem List   Diagnosis Date Noted   'Light-for-dates' infant with signs of fetal malnutrition 12/30/2021   Uncontrolled type 2 diabetes mellitus with hyperglycemia 06/04/2021   Insulin dose changed 11/23/2018   Generalized anxiety disorder 08/09/2018    Type 2 diabetes mellitus treated with insulin 01/12/2017   Hidradenitis suppurativa 12/12/2016   Anisocoria 12/12/2016   Undiagnosed cardiac murmurs 12/12/2016   Obesity, unspecified 12/04/2013   Mild acne 12/04/2013   Change in voice 11/01/2013   Panic attack 11/01/2013   Sinus tachycardia 11/01/2013    Past Surgical History:  Procedure Laterality Date   MOUTH SURGERY      OB History   No obstetric history on file.      Home Medications    Prior to Admission medications   Medication Sig Start Date End Date Taking? Authorizing Provider  Continuous Blood Gluc Sensor (DEXCOM G6 SENSOR) MISC USE 1 SENSOR EVERY 10 DAYS AS DIRECTED 08/04/22  Yes Dessa Phi, MD  insulin degludec (TRESIBA FLEXTOUCH) 200 UNIT/ML FlexTouch Pen Inject up to 120 units daily as directed by physician 11/24/21  Yes Dessa Phi, MD  Insulin Pen Needle (BD PEN NEEDLE NANO 2ND GEN) 32G X 4 MM MISC USE TO INJECT INSULIN 6 TIMES DAILY 09/27/22  Yes Dessa Phi, MD  tirzepatide [facility]) 12.5 MG/0.5ML Pen Inject 12.5 mg into the skin once a week. 08/30/22  Yes Dessa Phi, MD  Accu-Chek Softclix Lancets lancets Use to check sugar 6 times daily. Patient not taking: Reported on 04/20/2022 02/16/22   Dessa Phi, MD  Ascorbic Acid (VITAMIN C ADULT GUMMIES PO) Take by mouth. Patient not taking: Reported on 12/03/2020    [provider]  Blood Glucose Monitoring Suppl (ACCU-CHEK GUIDE) w/Device  KIT Use to check sugars up to 6 times daily. Patient not taking: Reported on 04/20/2022 02/16/22   Dessa PhiBadik, Jennifer, MD  Clindamycin-Benzoyl Per, Refr, gel Apply 1 application  topically 2 (two) times daily. 06/23/22   Dessa PhiBadik, Jennifer, MD  Continuous Blood Gluc Receiver (DEXCOM G6 RECEIVER) DEVI 1 each by Does not apply route as directed. Patient not taking: Reported on 01/12/2022 09/02/20   Dessa PhiBadik, Jennifer, MD  Continuous Blood Gluc Transmit (DEXCOM G6 TRANSMITTER) MISC 1 each by Does not apply route every 3  (three) months. Patient not taking: Reported on 04/20/2022 12/08/21   Dessa PhiBadik, Jennifer, MD  Continuous Blood Gluc Transmit (DEXCOM G6 TRANSMITTER) MISC 1 EACH BUT DOES NOT APPLY ROUTE EVERY 3 MONTHS. Patient not taking: Reported on 01/12/2022 12/08/21   Dessa PhiBadik, Jennifer, MD  dimenhyDRINATE (DRAMAMINE) 50 MG tablet Take by mouth every 8 (eight) hours as needed. Patient not taking: Reported on 06/04/2021    [provider]  doxycycline (VIBRAMYCIN) 100 MG capsule Take 1 capsule (100 mg total) by mouth 2 (two) times daily. One po bid x 7 days 06/19/22   Arthor CaptainHarris, Abigail, PA-C  fluconazole (DIFLUCAN) 150 MG tablet Take 1 tablet (150 mg total) by mouth as needed. Patient not taking: Reported on 04/20/2022 02/15/22   Dessa PhiBadik, Jennifer, MD  Glucagon (BAQSIMI TWO PACK) 3 MG/DOSE POWD Place 1 each into the nose as needed (severe hypoglycmia with unresponsiveness). Patient not taking: Reported on 01/24/2020 08/08/18   Dessa PhiBadik, Jennifer, MD  glucose blood (ACCU-CHEK GUIDE) test strip Use as directed 6 times daily. Patient not taking: Reported on 04/20/2022 02/16/22   Dessa PhiBadik, Jennifer, MD  hydrOXYzine (ATARAX/VISTARIL) 10 MG tablet Take 1 tablet (10 mg total) by mouth 3 (three) times daily as needed. Patient not taking: Reported on 01/12/2022 10/18/19   Dessa PhiBadik, Jennifer, MD  insulin degludec (TRESIBA FLEXTOUCH) 200 UNIT/ML FlexTouch Pen ADMINISTER UP TO 120 UNITS UNDER THE SKIN EVERY DAY AS DIRECTED BY PRESCRIBER Patient not taking: Reported on 01/12/2022 12/08/21   Dessa PhiBadik, Jennifer, MD  meclizine (ANTIVERT) 25 MG tablet Take 1 tablet (25 mg total) by mouth 3 (three) times daily as needed for dizziness. 10/02/22   Ellsworth LennoxJames, Calah Gershman, PA-C  metFORMIN (GLUCOPHAGE-XR) 750 MG 24 hr tablet Take 1 tablet (750 mg total) by mouth in the morning and at bedtime. Patient not taking: Reported on 09/29/2021 03/17/21   Dessa PhiBadik, Jennifer, MD  NOVOLOG FLEXPEN 100 UNIT/ML FlexPen INJECT UP TO 100 UNITS UNDER THE SKIN DAILY PER DIABETES CARE  PLAN Patient not taking: Reported on 12/03/2020 05/30/20   Dessa PhiBadik, Jennifer, MD  ondansetron (ZOFRAN) 8 MG tablet Take 1 tablet (8 mg total) by mouth every 8 (eight) hours as needed for nausea or vomiting. Patient not taking: Reported on 04/20/2022 01/20/22   Dessa PhiBadik, Jennifer, MD  tirzepatide [facility](MOUNJARO) 15 MG/0.5ML Pen Inject 15 mg into the skin once a week. Patient not taking: Reported on 04/20/2022 11/24/21   Dessa PhiBadik, Jennifer, MD    Family History Family History  Problem Relation Age of Onset   Diabetes Mother     Social History Social History   Tobacco Use   Smoking status: Never   Smokeless tobacco: Never  Substance Use Topics   Alcohol use: Never   Drug use: Never     Allergies   Patient has no known allergies.   Review of Systems Review of Systems  Gastrointestinal:  Positive for nausea.  Neurological:  Positive for dizziness.     Physical Exam Triage Vital Signs ED Triage Vitals [10/02/22  1023]  Enc Vitals Group     BP 121/85     Pulse Rate (!) 109     Resp 16     Temp 98.3 F (36.8 C)     Temp Source Oral     SpO2 98 %     Weight      Height      Head Circumference      Peak Flow      Pain Score 0     Pain Loc      Pain Edu?      Excl. in GC?    No data found.  Updated Vital Signs BP 121/85 (BP Location: Left Arm)   Pulse (!) 109   Temp 98.3 F (36.8 C) (Oral)   Resp 16   LMP 09/17/2022   SpO2 98%   Visual Acuity Right Eye Distance:   Left Eye Distance:   Bilateral Distance:    Right Eye Near:   Left Eye Near:    Bilateral Near:     Physical Exam Constitutional:      Appearance: Normal appearance.  HENT:     Right Ear: Tympanic membrane and ear canal normal.     Left Ear: Tympanic membrane and ear canal normal.     Mouth/Throat:     Mouth: Mucous membranes are moist.     Pharynx: Oropharynx is clear. Uvula midline.  Cardiovascular:     Rate and Rhythm: Normal rate and regular rhythm.     Heart sounds: Normal heart sounds.   Pulmonary:     Effort: Pulmonary effort is normal.     Breath sounds: Normal breath sounds and air entry. No wheezing, rhonchi or rales.  Abdominal:     General: Abdomen is flat.     Palpations: Abdomen is soft.     Tenderness: There is no abdominal tenderness.  Lymphadenopathy:     Cervical: No cervical adenopathy.  Neurological:     General: No focal deficit present.     Mental Status: She is alert and oriented to person, place, and time.     Cranial Nerves: Cranial nerves 2-12 are intact.     Coordination: Coordination is intact.      UC Treatments / Results  Labs (all labs ordered are listed, but only abnormal results are displayed) Labs Reviewed  CBG MONITORING, ED - Abnormal; Notable for the following components:      Result Value   Glucose-Capillary 165 (*)    All other components within normal limits    EKG   Radiology No results found.  Procedures Procedures (including critical care time)  Medications Ordered in UC Medications  ondansetron (ZOFRAN-ODT) disintegrating tablet 4 mg (4 mg Oral Given 10/02/22 1049)    Initial Impression / Assessment and Plan / UC Course  I have reviewed the triage vital signs and the nursing notes.  Pertinent labs & imaging results that were available during my care of the patient were reviewed by me and considered in my medical decision making (see chart for details).    Plan: The diagnosis will be treated with the following: 1.  Vertigo: A.  Antivert 25 mg every 6-8 hours as needed for dizziness and nausea. 2.  Nausea: A.  Zofran 4 mg given this morning to help counteract the nausea. 3.  Patient advised to follow-up with PCP or return to urgent care if symptoms fail to improve. Final Clinical Impressions(s) / UC Diagnoses   Final diagnoses:  Vertigo  Nausea  Discharge Instructions      Advised to continue to keep a close watch on glucose levels.  Advised to make sure to drink fluids on a regular basis and  eat. Advised to consult endocrinologist if needed if glucose levels start having drastic swings.  Advised to take Antivert 25 mg every 6-8 hours as needed for dizziness and nausea.  Advised follow-up PCP return to urgent care as needed.    ED Prescriptions     Medication Sig Dispense Auth. Provider   meclizine (ANTIVERT) 25 MG tablet Take 1 tablet (25 mg total) by mouth 3 (three) times daily as needed for dizziness. 30 tablet Ellsworth Lennox, PA-C      PDMP not reviewed this encounter.   Ellsworth Lennox, PA-C 10/02/22 1101

## 2022-10-04 ENCOUNTER — Other Ambulatory Visit: Payer: Self-pay

## 2022-10-04 ENCOUNTER — Telehealth (INDEPENDENT_AMBULATORY_CARE_PROVIDER_SITE_OTHER): Payer: Self-pay | Admitting: Pediatric Endocrinology

## 2022-10-04 ENCOUNTER — Emergency Department (HOSPITAL_COMMUNITY)
Admission: EM | Admit: 2022-10-04 | Discharge: 2022-10-05 | Disposition: A | Discharge status: Home / Self Care | Payer: Medicaid Other | Attending: Emergency Medicine | Admitting: Emergency Medicine

## 2022-10-04 ENCOUNTER — Other Ambulatory Visit (HOSPITAL_BASED_OUTPATIENT_CLINIC_OR_DEPARTMENT_OTHER): Payer: Self-pay

## 2022-10-04 ENCOUNTER — Telehealth (INDEPENDENT_AMBULATORY_CARE_PROVIDER_SITE_OTHER): Payer: Self-pay | Admitting: Pediatrics

## 2022-10-04 DIAGNOSIS — H8112 Benign paroxysmal vertigo, left ear: Secondary | ICD-10-CM | POA: Diagnosis not present

## 2022-10-04 DIAGNOSIS — E119 Type 2 diabetes mellitus without complications: Secondary | ICD-10-CM | POA: Insufficient documentation

## 2022-10-04 DIAGNOSIS — R42 Dizziness and giddiness: Secondary | ICD-10-CM | POA: Diagnosis present

## 2022-10-04 DIAGNOSIS — E876 Hypokalemia: Secondary | ICD-10-CM | POA: Diagnosis not present

## 2022-10-04 DIAGNOSIS — Z794 Long term (current) use of insulin: Secondary | ICD-10-CM | POA: Diagnosis not present

## 2022-10-04 DIAGNOSIS — Z7984 Long term (current) use of oral hypoglycemic drugs: Secondary | ICD-10-CM | POA: Insufficient documentation

## 2022-10-04 DIAGNOSIS — R000 Tachycardia, unspecified: Secondary | ICD-10-CM | POA: Diagnosis not present

## 2022-10-04 LAB — CBC
HCT: 38.6 % (ref 36.0–46.0)
Hemoglobin: 13.3 g/dL (ref 12.0–15.0)
MCH: 26.2 pg (ref 26.0–34.0)
MCHC: 34.5 g/dL (ref 30.0–36.0)
MCV: 76.1 fL — ABNORMAL LOW (ref 80.0–100.0)
Platelets: 248 10*3/uL (ref 150–400)
RBC: 5.07 MIL/uL (ref 3.87–5.11)
RDW: 17.6 % — ABNORMAL HIGH (ref 11.5–15.5)
WBC: 10.2 10*3/uL (ref 4.0–10.5)
nRBC: 0 % (ref 0.0–0.2)

## 2022-10-04 LAB — BASIC METABOLIC PANEL
Anion gap: 13 (ref 5–15)
BUN: <5 mg/dL — ABNORMAL LOW (ref 6–20)
CO2: 23 mmol/L (ref 22–32)
Calcium: 8.9 mg/dL (ref 8.9–10.3)
Chloride: 101 mmol/L (ref 98–111)
Creatinine, Ser: 0.54 mg/dL (ref 0.44–1.00)
GFR, Estimated: >60 mL/min (ref >60)
Glucose, Bld: 137 mg/dL — ABNORMAL HIGH (ref 70–99)
Potassium: 3.3 mmol/L — ABNORMAL LOW (ref 3.5–5.1)
Sodium: 137 mmol/L (ref 135–145)

## 2022-10-04 LAB — URINALYSIS, ROUTINE W REFLEX MICROSCOPIC
Bilirubin Urine: NEGATIVE
Glucose, UA: NEGATIVE mg/dL
Hgb urine dipstick: NEGATIVE
Ketones, ur: NEGATIVE mg/dL
Leukocytes,Ua: NEGATIVE
Nitrite: NEGATIVE
Protein, ur: NEGATIVE mg/dL
Specific Gravity, Urine: 1.006 (ref 1.005–1.030)
pH: 6 (ref 5.0–8.0)

## 2022-10-04 LAB — I-STAT BETA HCG BLOOD, ED (MC, WL, AP ONLY): I-stat hCG, quantitative: <5 m[IU]/mL (ref <5)

## 2022-10-04 LAB — CBG MONITORING, ED: Glucose-Capillary: 142 mg/dL — ABNORMAL HIGH (ref 70–99)

## 2022-10-04 MED ORDER — ONDANSETRON 4 MG PO TBDP: ORAL_TABLET | ORAL | 0 refills | Status: DC

## 2022-10-04 MED ORDER — SODIUM CHLORIDE 0.9 % IV BOLUS
1000.0000 mL | Freq: Once | INTRAVENOUS | Status: AC
  Administered 2022-10-04: 1000 mL via INTRAVENOUS

## 2022-10-04 MED ORDER — POTASSIUM CHLORIDE CRYS ER 20 MEQ PO TBCR
40.0000 meq | EXTENDED_RELEASE_TABLET | Freq: Once | ORAL | Status: AC
  Administered 2022-10-04: 40 meq via ORAL
  Filled 2022-10-04: qty 2

## 2022-10-04 MED ORDER — MECLIZINE HCL 25 MG PO TABS
25.0000 mg | ORAL_TABLET | Freq: Once | ORAL | Status: AC
  Administered 2022-10-04: 25 mg via ORAL
  Filled 2022-10-04: qty 1

## 2022-10-04 NOTE — Telephone Encounter (Signed)
Who's calling (name and relationship to patient) : Self  Best contact number: [phone]   Provider they see: [facility]  Reason for call: caller states she has vertigo and took dramamine to help but it isnt working well. Vertigo,dizziness and nausea wants to now what else she can take since dramamine isnt working   Call ID: [phone]     PRESCRIPTION REFILL ONLY  Name of prescription:  Pharmacy:

## 2022-10-04 NOTE — Telephone Encounter (Signed)
[facility] Call: c/o dizziness and vertigo with nausea. Requesting another medication other than zofran.   A/P: No concerns of diabetes -Recommended to contact PCP or go to urgent care for immediate evaluation and treatment.  Silvana Newness, MD 10/04/2022 (Late entry for 4/6 @9 :23AM)

## 2022-10-04 NOTE — Telephone Encounter (Signed)
See telephone call today at 7 am by Dr. Quincy Sheehan

## 2022-10-04 NOTE — Discharge Instructions (Addendum)
Use Zofran or meclizine as needed for vertigo symptoms. Stay well-hydrated as tolerated. Follow with ENT specialist.

## 2022-10-04 NOTE — ED Provider Notes (Signed)
[place] EM[facility] DEPARTMENT AT [facility] Provider Note   CSN: [id number] Arrival date & time: 10/04/22  2013     History  Chief Complaint  Patient presents with   Dizziness    Holly [name] is a 22 y.o. female.  Patient presents for recurrent vertigo symptoms since the weekend.  Patient seen urgent care and given meclizine and Zofran with intermittent improvement however patient's had worsening dizziness and nausea.  Patient's had this multiple times in the past.  Patient denies any lateral weakness or numbness.  No head injuries.  No significant headache.  Patient has diabetes history.  Patient has had some ringing in symptoms and ear worse on the left.       Home Medications Prior to Admission medications   Medication Sig Start Date End Date Taking? Authorizing Provider  ondansetron (ZOFRAN-ODT) 4 MG disintegrating tablet 4mg  ODT q4 hours prn nausea/vomit 10/04/22  Yes Blane Ohara, MD  Accu-Chek Softclix Lancets lancets Use to check sugar 6 times daily. Patient not taking: Reported on 04/20/2022 02/16/22   Dessa Phi, MD  Ascorbic Acid (VITAMIN C ADULT GUMMIES PO) Take by mouth. Patient not taking: Reported on 12/03/2020    [provider]  Blood Glucose Monitoring Suppl (ACCU-CHEK GUIDE) w/Device KIT Use to check sugars up to 6 times daily. Patient not taking: Reported on 04/20/2022 02/16/22   Dessa Phi, MD  Clindamycin-Benzoyl Per, Refr, gel Apply 1 application  topically 2 (two) times daily. 06/23/22   Dessa Phi, MD  Continuous Blood Gluc Receiver (DEXCOM G6 RECEIVER) DEVI 1 each by Does not apply route as directed. Patient not taking: Reported on 01/12/2022 09/02/20   Dessa Phi, MD  Continuous Blood Gluc Sensor (DEXCOM G6 SENSOR) MISC USE 1 SENSOR EVERY 10 DAYS AS DIRECTED 08/04/22   Dessa Phi, MD  Continuous Blood Gluc Transmit (DEXCOM G6 TRANSMITTER) MISC 1 each by Does not apply route every 3 (three)  months. Patient not taking: Reported on 04/20/2022 12/08/21   Dessa Phi, MD  Continuous Blood Gluc Transmit (DEXCOM G6 TRANSMITTER) MISC 1 EACH BUT DOES NOT APPLY ROUTE EVERY 3 MONTHS. Patient not taking: Reported on 01/12/2022 12/08/21   Dessa Phi, MD  dimenhyDRINATE (DRAMAMINE) 50 MG tablet Take by mouth every 8 (eight) hours as needed. Patient not taking: Reported on 06/04/2021    [provider]  doxycycline (VIBRAMYCIN) 100 MG capsule Take 1 capsule (100 mg total) by mouth 2 (two) times daily. One po bid x 7 days 06/19/22   Arthor Captain, PA-C  fluconazole (DIFLUCAN) 150 MG tablet Take 1 tablet (150 mg total) by mouth as needed. Patient not taking: Reported on 04/20/2022 02/15/22   Dessa Phi, MD  Glucagon (BAQSIMI TWO PACK) 3 MG/DOSE POWD Place 1 each into the nose as needed (severe hypoglycmia with unresponsiveness). Patient not taking: Reported on 01/24/2020 08/08/18   Dessa Phi, MD  glucose blood (ACCU-CHEK GUIDE) test strip Use as directed 6 times daily. Patient not taking: Reported on 04/20/2022 02/16/22   Dessa Phi, MD  hydrOXYzine (ATARAX/VISTARIL) 10 MG tablet Take 1 tablet (10 mg total) by mouth 3 (three) times daily as needed. Patient not taking: Reported on 01/12/2022 10/18/19   Dessa Phi, MD  insulin degludec (TRESIBA FLEXTOUCH) 200 UNIT/ML FlexTouch Pen Inject up to 120 units daily as directed by physician 11/24/21   Dessa Phi, MD  insulin degludec (TRESIBA FLEXTOUCH) 200 UNIT/ML FlexTouch Pen ADMINISTER UP TO 120 UNITS UNDER THE SKIN EVERY DAY AS DIRECTED BY PRESCRIBER Patient not  taking: Reported on 01/12/2022 12/08/21   Dessa Phi, MD  Insulin Pen Needle (BD PEN NEEDLE NANO 2ND GEN) 32G X 4 MM MISC USE TO INJECT INSULIN 6 TIMES DAILY 09/27/22   Dessa Phi, MD  meclizine (ANTIVERT) 25 MG tablet Take 1 tablet (25 mg total) by mouth 3 (three) times daily as needed for dizziness. 10/02/22   Ellsworth Lennox, PA-C  metFORMIN  (GLUCOPHAGE-XR) 750 MG 24 hr tablet Take 1 tablet (750 mg total) by mouth in the morning and at bedtime. Patient not taking: Reported on 09/29/2021 03/17/21   Dessa Phi, MD  NOVOLOG FLEXPEN 100 UNIT/ML FlexPen INJECT UP TO 100 UNITS UNDER THE SKIN DAILY PER DIABETES CARE PLAN Patient not taking: Reported on 12/03/2020 05/30/20   Dessa Phi, MD  ondansetron (ZOFRAN) 8 MG tablet Take 1 tablet (8 mg total) by mouth every 8 (eight) hours as needed for nausea or vomiting. Patient not taking: Reported on 04/20/2022 01/20/22   Dessa Phi, MD  tirzepatide [facility]) 12.5 MG/0.5ML Pen Inject 12.5 mg into the skin once a week. 08/30/22   Dessa Phi, MD  tirzepatide [facility] 15 MG/0.5ML Pen Inject 15 mg into the skin once a week. Patient not taking: Reported on 04/20/2022 11/24/21   Dessa Phi, MD      Allergies    Patient has no known allergies.    Review of Systems   Review of Systems  Constitutional:  Negative for chills and fever.  HENT:  Negative for congestion.   Eyes:  Negative for visual disturbance.  Respiratory:  Negative for shortness of breath.   Cardiovascular:  Negative for chest pain.  Gastrointestinal:  Negative for abdominal pain and vomiting.  Genitourinary:  Negative for dysuria and flank pain.  Musculoskeletal:  Negative for back pain, neck pain and neck stiffness.  Skin:  Negative for rash.  Neurological:  Positive for dizziness and light-headedness. Negative for headaches.    Physical Exam Updated Vital Signs BP (!) 142/87 (BP Location: Right Arm)   Pulse (!) 113   Temp 98.4 F (36.9 C)   Resp 16   Ht 5\' 2"  (1.575 m)   Wt 72.6 kg   LMP 09/17/2022   SpO2 99%   BMI 29.26 kg/m  Physical Exam Vitals and nursing note reviewed.  Constitutional:      General: She is not in acute distress.    Appearance: She is well-developed.  HENT:     Head: Normocephalic and atraumatic.     Mouth/Throat:     Mouth: Mucous membranes are moist.  Eyes:      General:        Right eye: No discharge.        Left eye: No discharge.     Conjunctiva/sclera: Conjunctivae normal.  Neck:     Trachea: No tracheal deviation.  Cardiovascular:     Rate and Rhythm: Tachycardia present.  Pulmonary:     Effort: Pulmonary effort is normal.  Abdominal:     General: There is no distension.     Palpations: Abdomen is soft.     Tenderness: There is no abdominal tenderness. There is no guarding.  Musculoskeletal:     Cervical back: Normal range of motion and neck supple. No rigidity.  Skin:    General: Skin is warm.     Capillary Refill: Capillary refill takes less than 2 seconds.     Findings: No rash.  Neurological:     General: No focal deficit present.     Mental Status:  She is alert.     Cranial Nerves: No cranial nerve deficit.     Comments: Patient cautiously moves head to left or right afraid of provoking dizziness.  Patient has equal strength in finger-nose intact and heel-to-shin intact bilateral.  Sensation intact bilateral.  No significant nystagmus.  Psychiatric:        Mood and Affect: Mood is anxious.     ED Results / Procedures / Treatments   Labs (all labs ordered are listed, but only abnormal results are displayed) Labs Reviewed  BASIC METABOLIC PANEL - Abnormal; Notable for the following components:      Result Value   Potassium 3.3 (*)    Glucose, Bld 137 (*)    BUN <5 (*)    All other components within normal limits  CBC - Abnormal; Notable for the following components:   MCV 76.1 (*)    RDW 17.6 (*)    All other components within normal limits  CBG MONITORING, ED - Abnormal; Notable for the following components:   Glucose-Capillary 142 (*)    All other components within normal limits  URINALYSIS, ROUTINE W REFLEX MICROSCOPIC  I-STAT BETA HCG BLOOD, ED ([facility], [facility], [facility] ONLY)    EKG EKG Interpretation  Date/Time:  Monday October 04 2022 20:18:46 EDT Ventricular Rate:  117 PR Interval:  134 QRS Duration: 80 QT  Interval:  334 QTC Calculation: 465 R Axis:   71 Text Interpretation: Sinus tachycardia Otherwise normal ECG When compared with ECG of 08-Apr-2020 18:34, PREVIOUS ECG IS PRESENT Confirmed by Blane OharaZavitz, [place] [phone]([id number]) on 10/04/2022 9:56:51 PM  Radiology No results found.  Procedures Procedures   Epley maneuver Performed by myself Patient started lying supine and turned head to the left where she has worsening signs of vertigo.  Held for 30 seconds then had her roll on her left side.  Patient then sat up for 30 seconds followed by resting. No significant change in her signs or symptoms.  Medications Ordered in ED Medications  potassium chloride SA (KLOR-CON M) CR tablet 40 mEq (has no administration in time range)  sodium chloride 0.9 % bolus 1,000 mL (1,000 mLs Intravenous New Bag/Given 10/04/22 2236)  meclizine (ANTIVERT) tablet 25 mg (25 mg Oral Given 10/04/22 2237)    ED Course/ Medical Decision Making/ A&P                             Medical Decision Making Amount and/or Complexity of Data Reviewed Labs: ordered.  Risk Prescription drug management.   Patient presents with recurrent vertigo with being overall healthy, young concern for BPPV.  Plan for blood work check for signs significant anemia or electrolyte abnormalities.  Blood work reviewed independently and hemoglobin electrolytes normal.  Discussed Epley's maneuver and demonstrated on patient discussed she could try at home as well.  Discussed follow-up with ENT since recurrent.  Glucose 140s.  Patient's parents in the room for discussion.  Zofran ordered IV fluids for mild tachycardia, mild dehydration and dizziness.  Pregnancy test pending.  Blood work overall reassuring normal white count, normal hemoglobin electrolytes unremarkable except mild hypokalemia, oral potassium ordered..  Plan for discharge and follow-up with ENT after IV fluids.  Zofran given in the ER.  No neurodeficits on exam.         Final  Clinical Impression(s) / ED Diagnoses Final diagnoses:  BPPV (benign paroxysmal positional vertigo), left  Hypokalemia    Rx / DC Orders ED Discharge  Orders          Ordered    ondansetron (ZOFRAN-ODT) 4 MG disintegrating tablet        10/04/22 2228              Blane Ohara, MD 10/04/22 2310

## 2022-10-04 NOTE — ED Triage Notes (Signed)
Pt seen for dizziness and nausea on Saturday at urgent care and given meclizine and zofran.  Pt states she was better on Sunday and then today has had terrible dizziness and nausea.

## 2022-10-05 MED ORDER — DIPHENHYDRAMINE HCL 25 MG PO CAPS
25.0000 mg | ORAL_CAPSULE | Freq: Once | ORAL | Status: AC
  Administered 2022-10-05: 25 mg via ORAL
  Filled 2022-10-05: qty 1

## 2022-10-05 MED ORDER — DIPHENHYDRAMINE HCL 25 MG PO TABS: 25.0000 mg | ORAL_TABLET | Freq: Four times a day (QID) | ORAL | 0 refills | Status: DC

## 2022-10-07 ENCOUNTER — Other Ambulatory Visit: Payer: Self-pay

## 2022-10-09 ENCOUNTER — Other Ambulatory Visit (HOSPITAL_BASED_OUTPATIENT_CLINIC_OR_DEPARTMENT_OTHER): Payer: Self-pay

## 2022-10-22 ENCOUNTER — Encounter (INDEPENDENT_AMBULATORY_CARE_PROVIDER_SITE_OTHER): Payer: Self-pay | Admitting: Pediatric Endocrinology

## 2022-10-22 ENCOUNTER — Other Ambulatory Visit (HOSPITAL_BASED_OUTPATIENT_CLINIC_OR_DEPARTMENT_OTHER): Payer: Self-pay

## 2022-10-25 ENCOUNTER — Other Ambulatory Visit (INDEPENDENT_AMBULATORY_CARE_PROVIDER_SITE_OTHER): Payer: Self-pay | Admitting: Pediatric Endocrinology

## 2022-10-25 DIAGNOSIS — Z794 Long term (current) use of insulin: Secondary | ICD-10-CM

## 2022-10-28 ENCOUNTER — Ambulatory Visit (INDEPENDENT_AMBULATORY_CARE_PROVIDER_SITE_OTHER): Payer: Medicaid Other | Admitting: Pediatric Endocrinology

## 2022-10-28 DIAGNOSIS — Z87898 Personal history of other specified conditions: Secondary | ICD-10-CM | POA: Insufficient documentation

## 2022-10-28 DIAGNOSIS — M2669 Other specified disorders of temporomandibular joint: Secondary | ICD-10-CM | POA: Insufficient documentation

## 2022-11-09 ENCOUNTER — Other Ambulatory Visit (HOSPITAL_BASED_OUTPATIENT_CLINIC_OR_DEPARTMENT_OTHER): Payer: Self-pay

## 2022-11-09 MED ORDER — ESCITALOPRAM OXALATE 10 MG PO TABS
10.0000 mg | ORAL_TABLET | Freq: Every day | ORAL | 0 refills | Status: DC
  Filled 2022-11-09: qty 30, 30d supply, fill #0

## 2022-11-14 ENCOUNTER — Other Ambulatory Visit (INDEPENDENT_AMBULATORY_CARE_PROVIDER_SITE_OTHER): Payer: Self-pay | Admitting: Pediatric Endocrinology

## 2022-11-15 ENCOUNTER — Other Ambulatory Visit: Payer: Self-pay

## 2022-11-15 ENCOUNTER — Other Ambulatory Visit (HOSPITAL_BASED_OUTPATIENT_CLINIC_OR_DEPARTMENT_OTHER): Payer: Self-pay

## 2022-11-15 MED ORDER — ACCU-CHEK GUIDE VI STRP
ORAL_STRIP | 5 refills | Status: DC
  Filled 2022-11-15: qty 200, 33d supply, fill #0
  Filled 2022-12-01 – 2023-01-02 (×4): qty 200, 33d supply, fill #1
  Filled 2023-02-01: qty 200, 33d supply, fill #2
  Filled 2023-02-21: qty 200, 30d supply, fill #3

## 2022-11-17 ENCOUNTER — Ambulatory Visit (INDEPENDENT_AMBULATORY_CARE_PROVIDER_SITE_OTHER): Payer: Medicaid Other | Admitting: Pediatric Endocrinology

## 2022-11-17 ENCOUNTER — Encounter (INDEPENDENT_AMBULATORY_CARE_PROVIDER_SITE_OTHER): Payer: Self-pay | Admitting: Pediatric Endocrinology

## 2022-11-17 ENCOUNTER — Other Ambulatory Visit (HOSPITAL_BASED_OUTPATIENT_CLINIC_OR_DEPARTMENT_OTHER): Payer: Self-pay

## 2022-11-17 VITALS — BP 122/68 | HR 76 | Ht 62.36 in | Wt 168.4 lb

## 2022-11-17 DIAGNOSIS — E1165 Type 2 diabetes mellitus with hyperglycemia: Secondary | ICD-10-CM | POA: Diagnosis not present

## 2022-11-17 DIAGNOSIS — Z7985 Long-term (current) use of injectable non-insulin antidiabetic drugs: Secondary | ICD-10-CM | POA: Diagnosis not present

## 2022-11-17 DIAGNOSIS — E785 Hyperlipidemia, unspecified: Secondary | ICD-10-CM

## 2022-11-17 DIAGNOSIS — Z794 Long term (current) use of insulin: Secondary | ICD-10-CM

## 2022-11-17 DIAGNOSIS — E119 Type 2 diabetes mellitus without complications: Secondary | ICD-10-CM

## 2022-11-17 LAB — POCT GLYCOSYLATED HEMOGLOBIN (HGB A1C): Hemoglobin A1C: 6.9 % — AB (ref 4.0–5.6)

## 2022-11-17 LAB — POCT GLUCOSE (DEVICE FOR HOME USE): POC Glucose: 199 mg/dl — AB (ref 70–99)

## 2022-11-17 MED ORDER — MOUNJARO 15 MG/0.5ML ~~LOC~~ SOAJ
15.0000 mg | SUBCUTANEOUS | 5 refills | Status: DC
Start: 2022-11-17 — End: 2023-02-24
  Filled 2022-11-17 – 2023-01-02 (×3): qty 2, 28d supply, fill #0
  Filled 2023-02-01: qty 2, 28d supply, fill #1
  Filled 2023-02-21: qty 2, 28d supply, fill #2

## 2022-11-17 NOTE — Progress Notes (Signed)
Subjective:  Subjective  Patient Name: Holly [name] Date of Birth: Jan 26, 2001  MRN: [id number]  Holly [name]  presents to the office today for follow up evaluation and management of her  diabetes  HISTORY OF PRESENT ILLNESS:   Holly [name] is a 22 y.o. Hispanic female   Holly [name] was unaccompanied  1. Holly [name] was seen in pediatric clinic in June 2018 for her 16 year WCC. At that visit they obtained labs which revealed a hemoglobin a1c of 12%. Her PCP called her to go to the ER for evaluation but family was in [place] for vacation. She went to [facility] and had her initial evaluation there. She was antibody negative for GAD, Islet Cell and Insulin antibodies.  She was started on Lantus with sliding scale only Novolog. She was instructed to limit carbs to 60 grams per meal.  She transferred care to our clinic in July 2018 after returning from [place].   2. Holly [name] was last seen in pediatric endocrine clinic on 04/20/22 . In the interim she has been generally healthy.   She is currently taking Tresiba 72 units and Mounjaro 12.5 mg.   She has been missing some Tresiba doses. She thinks she is missing 1-3 doses a week.   She can definitely see a difference in her sugars when she does not take her [place].   She says that her sugars have been "okay". She just came back from vacation and admits that she "indulged" there.   She is sometimes getting areas of bruising when she injects her [place]. She does not get them most of the time when she injects - but she currently has one on her stomach.   She is no longer having the foul burps.    3. Pertinent Review of Systems:  Constitutional: The patient feels "pretty good". The patient seems healthy and active.  Eyes: Vision seems to be good. There are no recognized eye problems.  Neck: The patient has no complaints of anterior neck swelling, soreness, tenderness, pressure, discomfort, or difficulty  swallowing.   Heart: Heart rate increases with exercise or other physical activity. The patient has no complaints of palpitations, irregular heart beats, chest pain, or chest pressure.   Lungs: no asthma or wheezing Gastrointestinal: Bowel movents seem normal. The patient has no complaints of excessive hunger, acid reflux, upset stomach, stomach aches or pains, diarrhea, or constipation.  Legs: Muscle mass and strength seem normal. There are no complaints of numbness, tingling, burning, or pain. No edema is noted.  Feet: There are no obvious foot problems. There are no complaints of numbness, tingling, burning, or pain. No edema is noted. Neurologic: There are no recognized problems with muscle movement and strength, sensation, or coordination. GYN/GU:  LMP 11/04/22. Regular. Short duration of flow Skin: mild acne. Stretch marks.   Diabetes ID: None- does have it on her phone   Injection sites: stomach.   Annual Labs - Done April 2023- no issues  DUE NOW    Dexcom CGM:    Patient was on VACATION during this interval:    Patient feels that this is a better reflection of how her glycemic control has been when she is not sick and not on vacation                 PAST MEDICAL, FAMILY, AND SOCIAL HISTORY  Past Medical History:  Diagnosis Date   Diabetes mellitus without complication (HCC)    Mild acne 12/04/2013   Obesity, unspecified 12/04/2013  Family History  Problem Relation Age of Onset   Diabetes Mother    Mom and maternal uncles with type 2 diabetes.   Current Outpatient Medications:    Accu-Chek Softclix Lancets lancets, Use to check sugar 6 times daily., Disp: 200 each, Rfl: 5   Blood Glucose Monitoring Suppl (ACCU-CHEK GUIDE) w/Device KIT, Use to check sugars up to 6 times daily., Disp: 1 kit, Rfl: 0   Clindamycin-Benzoyl Per, Refr, gel, Apply 1 application  topically 2 (two) times daily., Disp: 45 g, Rfl: 1   Continuous Blood Gluc Receiver (DEXCOM G6 RECEIVER)  DEVI, 1 each by Does not apply route as directed., Disp: 1 each, Rfl: 1   Continuous Blood Gluc Transmit (DEXCOM G6 TRANSMITTER) MISC, 1 each by Does not apply route every 3 (three) months., Disp: 1 each, Rfl: 3   Continuous Blood Gluc Transmit (DEXCOM G6 TRANSMITTER) MISC, 1 EACH BUT DOES NOT APPLY ROUTE EVERY 3 MONTHS., Disp: 1 each, Rfl: 3   Continuous Glucose Sensor (DEXCOM G6 SENSOR) MISC, USE 1 SENSOR EVERY 10 DAYS AS DIRECTED, Disp: 3 each, Rfl: 5   Glucagon (BAQSIMI TWO PACK) 3 MG/DOSE POWD, Place 1 each into the nose as needed (severe hypoglycmia with unresponsiveness)., Disp: 1 each, Rfl: 3   glucose blood (ACCU-CHEK GUIDE) test strip, Use as directed 6 times daily., Disp: 200 each, Rfl: 5   hydrOXYzine (ATARAX/VISTARIL) 10 MG tablet, Take 1 tablet (10 mg total) by mouth 3 (three) times daily as needed., Disp: 60 tablet, Rfl: 2   insulin degludec (TRESIBA FLEXTOUCH) 200 UNIT/ML FlexTouch Pen, Inject up to 120 units daily as directed by physician, Disp: 30 mL, Rfl: 11   insulin degludec (TRESIBA FLEXTOUCH) 200 UNIT/ML FlexTouch Pen, ADMINISTER UP TO 120 UNITS UNDER THE SKIN EVERY DAY AS DIRECTED BY PRESCRIBER, Disp: 30 mL, Rfl: 11   Insulin Pen Needle (BD PEN NEEDLE NANO 2ND GEN) 32G X 4 MM MISC, USE TO INJECT INSULIN 6 TIMES DAILY, Disp: 200 each, Rfl: 5   meclizine (ANTIVERT) 25 MG tablet, Take 1 tablet (25 mg total) by mouth 3 (three) times daily as needed for dizziness., Disp: 30 tablet, Rfl: 1   ondansetron (ZOFRAN) 8 MG tablet, Take 1 tablet (8 mg total) by mouth every 8 (eight) hours as needed for nausea or vomiting., Disp: 20 tablet, Rfl: 0   ondansetron (ZOFRAN-ODT) 4 MG disintegrating tablet, 4mg  ODT q4 hours prn nausea/vomit, Disp: 10 tablet, Rfl: 0   tirzepatide (MOUNJARO) 12.5 MG/0.5ML Pen, Inject 12.5 mg into the skin once a week., Disp: 2 mL, Rfl: 3   Ascorbic Acid (VITAMIN C ADULT GUMMIES PO), Take by mouth. (Patient not taking: Reported on 12/03/2020), Disp: , Rfl:     dimenhyDRINATE (DRAMAMINE) 50 MG tablet, Take by mouth every 8 (eight) hours as needed. (Patient not taking: Reported on 06/04/2021), Disp: , Rfl:    diphenhydrAMINE (BENADRYL) 25 MG tablet, Take 1 tablet (25 mg total) by mouth every 6 (six) hours. (Patient not taking: Reported on 11/17/2022), Disp: 20 tablet, Rfl: 0   escitalopram (LEXAPRO) 10 MG tablet, Take one by mouth tablet daily for anxiety/depression, Disp: 30 tablet, Rfl: 0   tirzepatide (MOUNJARO) 15 MG/0.5ML Pen, Inject 15 mg into the skin once a week., Disp: 2 mL, Rfl: 5  Allergies as of 11/17/2022   (No Known Allergies)     reports that she has never smoked. She has never been exposed to tobacco smoke. She has never used smokeless tobacco. She reports that she does not  drink alcohol and does not use drugs. Pediatric History  Patient Parents   Holly [name],Holly (Mother)   Holly [name],Holly (Father)   Other Topics Concern   Not on file  Social History Narrative   No College. Not working right now.     1. School and Family:  Lives with parents and sister. Eye lashes and nails. 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 Activities: She is not currently exercising outside of work.  3. Primary Care Provider: Pcp, No  ROS: There are no other significant problems involving Holly [name]'s other body systems.    Objective:  Objective   Vital Signs:     04/20/22 09:25  BP 110/68  Pulse Rate 112 !  Weight 171 lb 3.2 oz  !: Data is abnormal BP 122/68   Pulse 76   Ht 5' 2.36" (1.584 m)   Wt 168 lb 6.9 oz (76.4 kg)   LMP 11/04/2022 (Exact Date)   BMI 30.45 kg/m   Growth %ile SmartLinks can only be used for patients less than 82 years old.  Ht Readings from Last 3 Encounters:  11/17/22 5' 2.36" (1.584 m)  10/04/22 5\' 2"  (1.575 m)  06/19/22 5\' 2"  (1.575 m)   Wt Readings from Last 3 Encounters:  11/17/22 168 lb 6.9 oz (76.4 kg)  10/04/22 160 lb (72.6 kg)  06/19/22 160 lb (72.6 kg)   Estimated body mass index is 30.45 kg/m as calculated from  the following:   Height as of this encounter: 5' 2.36" (1.584 m).   Weight as of this encounter: 168 lb 6.9 oz (76.4 kg). Facility age limit for growth %iles is 20 years.  Constitutional: The patient appears healthy and well nourished. The patient's height and weight are normal for age. She has lost 3 pounds since last visit  Head: The head is normocephalic. Face: The face appears normal. There are no obvious dysmorphic features. Eyes: The eyes appear to be normally formed and spaced. Gaze is conjugate. There is no obvious arcus or proptosis. Moisture appears normal. Ears: The ears are normally placed and appear externally normal. Mouth: The oropharynx and tongue appear normal. Dentition appears to be normal for age. Oral moisture is normal. Neck: The neck appears to be visibly normal. The consistency of the thyroid gland is normal. The thyroid gland is not tender to palpation. Lungs: No increased work of breathing. CTA.  Heart: Normal pulses and peripheral perfusion. RRR S1S2 Abdomen: The abdomen appears to be normal in size for the patient's age. . There is no obvious hepatomegaly, splenomegaly, or other mass effect.  Arms: Muscle size and bulk are normal for age. Hands: There is no obvious tremor. Phalangeal and metacarpophalangeal joints are normal. Palmar muscles are normal for age. Palmar skin is normal. Palmar moisture is also normal. Legs: Muscles appear normal for age. No edema is present. Feet: Feet are normally formed. Dorsalis pedal pulses are normal. Neurologic: Strength is normal for age in both the upper and lower extremities. Muscle tone is normal. Sensation to touch is normal in both the legs and feet.   GYN/GU: normal female  Skin: moderate facial acne. No acanthosis noted. Has green discoloration around neck from necklace  LAB DATA:     Lab Results  Component Value Date   HGBA1C 6.9 (A) 11/17/2022   HGBA1C 7.1 (A) 04/20/2022   HGBA1C 7.2 (A) 01/12/2022   HGBA1C 8.4  (H) 09/29/2021   HGBA1C 8.5 (A) 06/04/2021   HGBA1C 8.1 (A) 03/05/2021   HGBA1C 7.9 (A) 12/03/2020  HGBA1C 8.8 (H) 09/02/2020     Results for orders placed or performed in visit on 11/17/22  POCT glycosylated hemoglobin (Hb A1C)  Result Value Ref Range   Hemoglobin A1C 6.9 (A) 4.0 - 5.6 %   HbA1c POC (<> result, manual entry)     HbA1c, POC (prediabetic range)     HbA1c, POC (controlled diabetic range)    POCT Glucose (Device for Home Use)  Result Value Ref Range   Glucose Fasting, POC     POC Glucose 199 (A) 70 - 99 mg/dl      Assessment and Plan:  Assessment  ASSESSMENT: Holly [name] is a 22 y.o. 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 She was diagnosed at [facility] and was antibody negative based on records.   Diabetes     - POC CBG as above - A1C POC as above is not at goal (<6.5%). However, it is improved - Still taking Evaristo Bury- working on adherence - Tresiba 72 units - Now taking Mounjaro 12.5 mg- will increase to 15 mg if available.  - Dexcom CGM Reviewed report in detail with patient   PLAN:     1. Diagnostic: Lab Orders         Comprehensive metabolic panel         Lipid panel         Microalbumin / creatinine urine ratio         TSH         T4, free         Celiac Disease Comprehensive Panel with Reflexes         POCT glycosylated hemoglobin (Hb A1C)         POCT Glucose (Device for Home Use)     Referral placed in April for adult endocrinology- but not scheduled. Sent to our referral coordinator to investigate.   2. Therapeutic: Medications as above. Continue Tresiba 72 units.  Increase Mounjaro to 15 mg if available at pharmacy.  3. Patient education:  All discussion as above.  4. Follow-up: Return in about 3 months (around 02/15/2023).    Cancel this appointment if established in adult endo prior to visit.    Dessa Phi, MD  Level of Service: >40 minutes spent today reviewing the medical chart, counseling the patient/family,  and documenting today's encounter.   When a patient is on insulin, intensive monitoring of blood glucose levels is necessary to avoid hyperglycemia and hypoglycemia. Severe hyperglycemia/hypoglycemia can lead to hospital admissions and be life threatening.    Patient referred by No ref. provider found for IDDM   Copy of this note sent to Pcp, No

## 2022-11-17 NOTE — Patient Instructions (Signed)
Increase Mounjaro to 15 mg if available at pharmacy Continue Tresiba 72 units

## 2022-11-18 ENCOUNTER — Other Ambulatory Visit (HOSPITAL_BASED_OUTPATIENT_CLINIC_OR_DEPARTMENT_OTHER): Payer: Self-pay

## 2022-11-18 LAB — COMPREHENSIVE METABOLIC PANEL
AG Ratio: 1.4 (calc) (ref 1.0–2.5)
ALT: 20 U/L (ref 6–29)
AST: 13 U/L (ref 10–30)
Albumin: 4.2 g/dL (ref 3.6–5.1)
Alkaline phosphatase (APISO): 93 U/L (ref 31–125)
BUN/Creatinine Ratio: 21 (calc) (ref 6–22)
BUN: 8 mg/dL (ref 7–25)
CO2: 22 mmol/L (ref 20–32)
Calcium: 9.2 mg/dL (ref 8.6–10.2)
Chloride: 104 mmol/L (ref 98–110)
Creat: 0.39 mg/dL — ABNORMAL LOW (ref 0.50–0.96)
Globulin: 3.1 g/dL (calc) (ref 1.9–3.7)
Glucose, Bld: 182 mg/dL — ABNORMAL HIGH (ref 65–99)
Potassium: 3.9 mmol/L (ref 3.5–5.3)
Sodium: 137 mmol/L (ref 135–146)
Total Bilirubin: 0.7 mg/dL (ref 0.2–1.2)
Total Protein: 7.3 g/dL (ref 6.1–8.1)

## 2022-11-18 LAB — CELIAC DISEASE COMPREHENSIVE PANEL WITH REFLEXES
(tTG) Ab, IgA: 2.3 U/mL
Immunoglobulin A: 267 mg/dL (ref 47–310)

## 2022-11-18 LAB — LIPID PANEL
Cholesterol: 145 mg/dL (ref <200)
HDL: 43 mg/dL — ABNORMAL LOW (ref >=50)
LDL Cholesterol (Calc): 85 mg/dL (calc)
Non-HDL Cholesterol (Calc): 102 mg/dL (calc) (ref <130)
Total CHOL/HDL Ratio: 3.4 (calc) (ref <5.0)
Triglycerides: 82 mg/dL (ref <150)

## 2022-11-18 LAB — T4, FREE: Free T4: 1.1 ng/dL (ref 0.8–1.8)

## 2022-11-18 LAB — MICROALBUMIN / CREATININE URINE RATIO
Creatinine, Urine: 227 mg/dL (ref 20–275)
Microalb Creat Ratio: 4 mg/g creat (ref <30)
Microalb, Ur: 0.9 mg/dL

## 2022-11-18 LAB — TSH: TSH: 1.96 mIU/L

## 2022-11-25 ENCOUNTER — Other Ambulatory Visit (HOSPITAL_BASED_OUTPATIENT_CLINIC_OR_DEPARTMENT_OTHER): Payer: Self-pay

## 2022-11-25 MED ORDER — MIRTAZAPINE 7.5 MG PO TABS
7.5000 mg | ORAL_TABLET | Freq: Every day | ORAL | 0 refills | Status: DC
  Filled 2022-11-25: qty 30, 30d supply, fill #0

## 2022-11-25 MED ORDER — BUSPIRONE HCL 5 MG PO TABS
5.0000 mg | ORAL_TABLET | Freq: Two times a day (BID) | ORAL | 0 refills | Status: DC
  Filled 2022-11-25: qty 60, 30d supply, fill #0

## 2022-12-01 ENCOUNTER — Other Ambulatory Visit (HOSPITAL_BASED_OUTPATIENT_CLINIC_OR_DEPARTMENT_OTHER): Payer: Self-pay

## 2022-12-02 ENCOUNTER — Other Ambulatory Visit: Payer: Self-pay

## 2022-12-06 ENCOUNTER — Other Ambulatory Visit (INDEPENDENT_AMBULATORY_CARE_PROVIDER_SITE_OTHER): Payer: Self-pay | Admitting: Pediatric Endocrinology

## 2022-12-06 ENCOUNTER — Other Ambulatory Visit (HOSPITAL_BASED_OUTPATIENT_CLINIC_OR_DEPARTMENT_OTHER): Payer: Self-pay

## 2022-12-07 ENCOUNTER — Other Ambulatory Visit (HOSPITAL_BASED_OUTPATIENT_CLINIC_OR_DEPARTMENT_OTHER): Payer: Self-pay

## 2022-12-07 ENCOUNTER — Other Ambulatory Visit: Payer: Self-pay

## 2022-12-07 MED ORDER — DEXCOM G6 TRANSMITTER MISC
1.0000 | 3 refills | Status: DC
  Filled 2022-12-07: qty 1, 90d supply, fill #0
  Filled 2023-02-16 – 2023-02-21 (×2): qty 1, 90d supply, fill #1

## 2022-12-11 ENCOUNTER — Other Ambulatory Visit (HOSPITAL_BASED_OUTPATIENT_CLINIC_OR_DEPARTMENT_OTHER): Payer: Self-pay

## 2022-12-11 MED ORDER — BUSPIRONE HCL 5 MG PO TABS
5.0000 mg | ORAL_TABLET | Freq: Two times a day (BID) | ORAL | 0 refills | Status: DC
  Filled 2022-12-11: qty 60, 30d supply, fill #0

## 2022-12-11 MED ORDER — MIRTAZAPINE 15 MG PO TABS
15.0000 mg | ORAL_TABLET | Freq: Every day | ORAL | 0 refills | Status: DC
  Filled 2022-12-11: qty 30, 30d supply, fill #0

## 2022-12-12 ENCOUNTER — Other Ambulatory Visit (HOSPITAL_BASED_OUTPATIENT_CLINIC_OR_DEPARTMENT_OTHER): Payer: Self-pay

## 2022-12-13 ENCOUNTER — Other Ambulatory Visit (HOSPITAL_BASED_OUTPATIENT_CLINIC_OR_DEPARTMENT_OTHER): Payer: Self-pay

## 2022-12-20 ENCOUNTER — Other Ambulatory Visit (HOSPITAL_BASED_OUTPATIENT_CLINIC_OR_DEPARTMENT_OTHER): Payer: Self-pay

## 2022-12-20 ENCOUNTER — Encounter (HOSPITAL_BASED_OUTPATIENT_CLINIC_OR_DEPARTMENT_OTHER): Payer: Self-pay

## 2022-12-20 ENCOUNTER — Ambulatory Visit: Payer: Self-pay | Admitting: Nurse Practitioner

## 2022-12-28 ENCOUNTER — Other Ambulatory Visit: Payer: Self-pay

## 2022-12-29 ENCOUNTER — Other Ambulatory Visit (HOSPITAL_BASED_OUTPATIENT_CLINIC_OR_DEPARTMENT_OTHER): Payer: Self-pay

## 2022-12-31 ENCOUNTER — Ambulatory Visit: Payer: Medicaid Other | Admitting: Nurse Practitioner

## 2022-12-31 ENCOUNTER — Encounter (INDEPENDENT_AMBULATORY_CARE_PROVIDER_SITE_OTHER): Payer: Self-pay

## 2023-01-01 ENCOUNTER — Other Ambulatory Visit (HOSPITAL_BASED_OUTPATIENT_CLINIC_OR_DEPARTMENT_OTHER): Payer: Self-pay

## 2023-01-02 ENCOUNTER — Other Ambulatory Visit (HOSPITAL_BASED_OUTPATIENT_CLINIC_OR_DEPARTMENT_OTHER): Payer: Self-pay

## 2023-01-03 ENCOUNTER — Other Ambulatory Visit (HOSPITAL_BASED_OUTPATIENT_CLINIC_OR_DEPARTMENT_OTHER): Payer: Self-pay

## 2023-01-03 ENCOUNTER — Other Ambulatory Visit: Payer: Self-pay

## 2023-01-06 NOTE — Progress Notes (Signed)
Name: Holly [name]   MRN: [id number]    DOB: 08-Jul-2000   Date:01/07/2023       Progress Note  Subjective  Chief Complaint  Chief Complaint  Patient presents with   Establish Care   Diabetes   Depression   Anxiety    HPI  Patient presents for annual CPE and establish care  Establish care: her last physical was when she was 16.  Medical history includes diabetes, anxiety, depression.  Family history includes none.  Health maintenance cpe, pap.   Diabetes, Type 2:  -Last A1c 7.0  followed by endo last seen on 11/17/2022 -Medications: tresiba 72 units, mounjaro 12.5 mg -Patient is compliant with the above medications and reports no side effects.  -Checking BG at home: yes -Eye exam: utd -Foot exam: due -Microalbumin: utd  -Denies symptoms of hypoglycemia, polyuria, polydipsia, numbness extremities, foot ulcers/trauma.   Diet: she eats whatever Exercise: walks  Sleep: 5 hours Last dental exam:last year Last eye exam: last year  Flowsheet Row Office Visit from 01/07/2023 in [facility]  AUDIT-C Score 0      Depression: Phq 9 is  negative    01/07/2023    8:54 AM  Depression screen PHQ 2/9  Decreased Interest 0  Down, Depressed, Hopeless 0  PHQ - 2 Score 0  Altered sleeping 1  Tired, decreased energy 0  Change in appetite 0  Feeling bad or failure about yourself  0  Trouble concentrating 0  Moving slowly or fidgety/restless 0  Suicidal thoughts 0  PHQ-9 Score 1  Difficult doing work/chores Not difficult at all   Hypertension: BP Readings from Last 3 Encounters:  01/07/23 120/70  11/17/22 122/68  10/04/22 (!) 142/87   Obesity: Wt Readings from Last 3 Encounters:  01/07/23 172 lb (78 kg)  11/17/22 168 lb 6.9 oz (76.4 kg)  10/04/22 160 lb (72.6 kg)   BMI Readings from Last 3 Encounters:  01/07/23 31.46 kg/m  11/17/22 30.45 kg/m  10/04/22 29.26 kg/m     Vaccines:  HPV: up to at age 3 , ask insurance if age  between 83-45  Shingrix: 77-64 yo and ask insurance if covered when patient above 14 yo Pneumonia:  educated and discussed with patient. Flu:  educated and discussed with patient.  Hep C Screening: due, recently had labs will get at next appointment STD testing and prevention (HIV/chl/gon/syphilis): due, recently had labs will get at next appointment Intimate partner violence:none Sexual History : not sexually active Menstrual History/LMP/Abnormal Bleeding: [facility]: 01/01/2023, regular Incontinence Symptoms: none  Breast cancer:  - Last Mammogram: no concerns, does not qualify - BRCA gene screening: none  Osteoporosis: Discussed high calcium and vitamin D supplementation, weight bearing exercises  Cervical cancer screening: due  Skin cancer: Discussed monitoring for atypical lesions  Colorectal cancer: no concerns, does not qualify   Lung cancer:   Low Dose CT Chest recommended if Age 47-80 years, 20 pack-year currently smoking OR have quit w/in 15years. Patient does not qualify.   ECG: none  Advanced Care Planning: A voluntary discussion about advance care planning including the explanation and discussion of advance directives.  Discussed health care proxy and Living will, and the patient was able to identify a health care proxy as sister.  Patient does not have a living will at present time. If patient does have living will, I have requested they bring this to the clinic to be scanned in to their chart.  Lipids: Lab Results  Component Value  Date   CHOL 145 11/17/2022   CHOL 148 09/29/2021   CHOL 139 09/02/2020   Lab Results  Component Value Date   HDL 43 (L) 11/17/2022   HDL 39 (L) 09/29/2021   HDL 37 (L) 09/02/2020   Lab Results  Component Value Date   LDLCALC 85 11/17/2022   LDLCALC 92 09/29/2021   LDLCALC 83 09/02/2020   Lab Results  Component Value Date   TRIG 82 11/17/2022   TRIG 77 09/29/2021   TRIG 101 (H) 09/02/2020   Lab Results  Component Value Date    CHOLHDL 3.4 11/17/2022   CHOLHDL 3.8 09/29/2021   CHOLHDL 3.8 09/02/2020   No results found for: "LDLDIRECT"  Glucose: Glucose, Bld  Date Value Ref Range Status  11/17/2022 182 (H) 65 - 99 mg/dL Final    Comment:    .            Fasting reference interval . For someone without known diabetes, a glucose value >125 mg/dL indicates that they may have diabetes and this should be confirmed with a follow-up test. .   10/04/2022 137 (H) 70 - 99 mg/dL Final    Comment:    Glucose reference range applies only to samples taken after fasting for at least 8 hours.  09/29/2021 183 (H) 65 - 99 mg/dL Final    Comment:    .            Fasting reference interval . For someone without known diabetes, a glucose value >125 mg/dL indicates that they may have diabetes and this should be confirmed with a follow-up test. .    Glucose-Capillary  Date Value Ref Range Status  10/04/2022 142 (H) 70 - 99 mg/dL Final    Comment:    Glucose reference range applies only to samples taken after fasting for at least 8 hours.  10/02/2022 165 (H) 70 - 99 mg/dL Final    Comment:    Glucose reference range applies only to samples taken after fasting for at least 8 hours.  04/08/2020 179 (H) 70 - 99 mg/dL Final    Comment:    Glucose reference range applies only to samples taken after fasting for at least 8 hours.    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   'Light-for-dates' infant with signs of fetal malnutrition 12/30/2021   Uncontrolled type 2 diabetes mellitus with hyperglycemia (HCC) 06/04/2021   Insulin dose changed (HCC) 11/23/2018   Generalized anxiety disorder 08/09/2018   Type 2 diabetes mellitus treated with insulin (HCC) 01/12/2017   Hidradenitis suppurativa 12/12/2016   Anisocoria 12/12/2016   Undiagnosed cardiac murmurs 12/12/2016   Obesity, unspecified 12/04/2013   Mild acne 12/04/2013    Change in voice 11/01/2013   Panic attack 11/01/2013   Sinus tachycardia 11/01/2013    Past Surgical History:  Procedure Laterality Date   MOUTH SURGERY      Family History  Problem Relation Age of Onset   Diabetes Mother     Social History   Socioeconomic History   Marital status: Single    Spouse name: Not on file   Number of children: Not on file   Years of education: Not on file   Highest education level: Not on file  Occupational History   Not on file  Tobacco Use   Smoking status: Never    Passive exposure: Never   Smokeless tobacco: Never  Vaping Use  Vaping status: Never Used  Substance and Sexual Activity   Alcohol use: Never   Drug use: Never   Sexual activity: Not Currently  Other Topics Concern   Not on file  Social History Narrative   No College. Not working right now   Social Determinants of Health   Financial Resource Strain: Low Risk  (01/07/2023)   Overall Financial Resource Strain (CARDIA)    Difficulty of Paying Living Expenses: Not hard at all  Food Insecurity: Low Risk  (10/28/2022)   [facility]   Food vital sign    Within the past 12 months, you worried that your food would run out before you got money to buy more: Never true    Within the past 12 months, the food you bought just didn't last and you didn't have money to get more. : Never true  Transportation Needs: No Transportation Needs (10/28/2022)   [facility]   Transportation    In the past 12 months, has lack of reliable transportation kept you from medical appointments, meetings, work or from getting things needed for daily living? : No  Physical Activity: Sufficiently Active (01/07/2023)   Exercise Vital Sign    Days of Exercise per Week: 5 days    Minutes of Exercise per Session: 30 min  Stress: No Stress Concern Present (01/07/2023)   [employer] of Occupational Health - Occupational Stress Questionnaire     Feeling of Stress : Not at all  Social Connections: Moderately Integrated (01/07/2023)   Social Connection and Isolation Panel [NHANES]    Frequency of Communication with Friends and Family: More than three times a week    Frequency of Social Gatherings with Friends and Family: More than three times a week    Attends Religious Services: More than 4 times per year    Active Member of [employer] or Organizations: Yes    Attends Banker Meetings: More than 4 times per year    Marital Status: Never married  Intimate Partner Violence: Not At Risk (01/07/2023)   Humiliation, Afraid, Rape, and Kick questionnaire    Fear of Current or Ex-Partner: No    Emotionally Abused: No    Physically Abused: No    Sexually Abused: No     Current Outpatient Medications:    Clindamycin-Benzoyl Per, Refr, gel, Apply 1 application  topically 2 (two) times daily., Disp: 45 g, Rfl: 1   Glucagon (BAQSIMI TWO PACK) 3 MG/DOSE POWD, Place 1 each into the nose as needed (severe hypoglycmia with unresponsiveness)., Disp: 1 each, Rfl: 3   insulin degludec (TRESIBA FLEXTOUCH) 200 UNIT/ML FlexTouch Pen, Inject up to 120 units daily as directed by physician (Patient taking differently: Inject up to 120 units daily as directed by physician Currently at 70 units), Disp: 30 mL, Rfl: 11   meclizine (ANTIVERT) 25 MG tablet, Take 1 tablet (25 mg total) by mouth 3 (three) times daily as needed for dizziness., Disp: 30 tablet, Rfl: 1   ondansetron (ZOFRAN-ODT) 4 MG disintegrating tablet, 4mg  ODT q4 hours prn nausea/vomit, Disp: 10 tablet, Rfl: 0   tirzepatide (MOUNJARO) 15 MG/0.5ML Pen, Inject 15 mg into the skin once a week., Disp: 2 mL, Rfl: 5   Accu-Chek Softclix Lancets lancets, Use to check sugar 6 times daily. (Patient not taking: Reported on 01/07/2023), Disp: 200 each, Rfl: 5   Ascorbic Acid (VITAMIN C ADULT GUMMIES PO), Take by mouth. (Patient not taking: Reported on 01/07/2023), Disp: ,  Rfl:    Blood Glucose Monitoring  Suppl (ACCU-CHEK GUIDE) w/Device KIT, Use to check sugars up to 6 times daily. (Patient not taking: Reported on 01/07/2023), Disp: 1 kit, Rfl: 0   busPIRone (BUSPAR) 5 MG tablet, Take 1 tablet (5 mg total) by mouth 2 (two) times daily for anxiety. (Patient not taking: Reported on 01/07/2023), Disp: 60 tablet, Rfl: 0   Continuous Blood Gluc Receiver (DEXCOM G6 RECEIVER) DEVI, 1 each by Does not apply route as directed. (Patient not taking: Reported on 01/07/2023), Disp: 1 each, Rfl: 1   Continuous Blood Gluc Transmit (DEXCOM G6 TRANSMITTER) MISC, 1 EACH BUT DOES NOT APPLY ROUTE EVERY 3 MONTHS. (Patient not taking: Reported on 01/07/2023), Disp: 1 each, Rfl: 3   Continuous Glucose Sensor (DEXCOM G6 SENSOR) MISC, USE 1 SENSOR EVERY 10 DAYS AS DIRECTED (Patient not taking: Reported on 01/07/2023), Disp: 3 each, Rfl: 5   Continuous Glucose Transmitter (DEXCOM G6 TRANSMITTER) MISC, 1 each by Does not apply route every 3 (three) months. (Patient not taking: Reported on 01/07/2023), Disp: 1 each, Rfl: 3   dimenhyDRINATE (DRAMAMINE) 50 MG tablet, Take by mouth every 8 (eight) hours as needed. (Patient not taking: Reported on 06/04/2021), Disp: , Rfl:    diphenhydrAMINE (BENADRYL) 25 MG tablet, Take 1 tablet (25 mg total) by mouth every 6 (six) hours. (Patient not taking: Reported on 11/17/2022), Disp: 20 tablet, Rfl: 0   escitalopram (LEXAPRO) 10 MG tablet, Take one by mouth tablet daily for anxiety/depression (Patient not taking: Reported on 01/07/2023), Disp: 30 tablet, Rfl: 0   glucose blood (ACCU-CHEK GUIDE) test strip, Use as directed 6 times daily. (Patient not taking: Reported on 01/07/2023), Disp: 200 each, Rfl: 5   hydrOXYzine (ATARAX/VISTARIL) 10 MG tablet, Take 1 tablet (10 mg total) by mouth 3 (three) times daily as needed. (Patient not taking: Reported on 01/07/2023), Disp: 60 tablet, Rfl: 2   insulin degludec (TRESIBA FLEXTOUCH) 200 UNIT/ML FlexTouch Pen, ADMINISTER UP TO 120 UNITS UNDER THE SKIN EVERY DAY AS  DIRECTED BY PRESCRIBER (Patient not taking: Reported on 01/07/2023), Disp: 30 mL, Rfl: 11   Insulin Pen Needle (BD PEN NEEDLE NANO 2ND GEN) 32G X 4 MM MISC, USE TO INJECT INSULIN 6 TIMES DAILY (Patient not taking: Reported on 01/07/2023), Disp: 200 each, Rfl: 5   mirtazapine (REMERON) 15 MG tablet, Take one tablet by mouth at bedtime for depression/sleep. (Patient not taking: Reported on 01/07/2023), Disp: 30 tablet, Rfl: 0   mirtazapine (REMERON) 7.5 MG tablet, Take 1 tablet (7.5 mg total) by mouth at bedtime for depression/sleep. (Patient not taking: Reported on 01/07/2023), Disp: 30 tablet, Rfl: 0   ondansetron (ZOFRAN) 8 MG tablet, Take 1 tablet (8 mg total) by mouth every 8 (eight) hours as needed for nausea or vomiting. (Patient not taking: Reported on 01/07/2023), Disp: 20 tablet, Rfl: 0   tirzepatide (MOUNJARO) 12.5 MG/0.5ML Pen, Inject 12.5 mg into the skin once a week. (Patient not taking: Reported on 01/07/2023), Disp: 2 mL, Rfl: 3  No Known Allergies   ROS  Constitutional: Negative for fever or weight change.  Respiratory: Negative for cough and shortness of breath.   Cardiovascular: Negative for chest pain or palpitations.  Gastrointestinal: Negative for abdominal pain, no bowel changes.  Musculoskeletal: Negative for gait problem or joint swelling.  Skin: Negative for rash.  Neurological: Negative for dizziness or headache.  No other specific complaints in a complete review of systems (except as listed in HPI above).   Objective  Vitals:  01/07/23 0856  BP: 120/70  Resp: 18  Temp: 98 F (36.7 C)  TempSrc: Oral  Weight: 172 lb (78 kg)  Height: 5\' 2"  (1.575 m)    Body mass index is 31.46 kg/m.  Physical Exam Constitutional: Patient appears well-developed and well-nourished. No distress.  HENT: Head: Normocephalic and atraumatic. Ears: B TMs ok, no erythema or effusion; Nose: Nose normal. Mouth/Throat: Oropharynx is clear and moist. No oropharyngeal exudate.  Eyes:  Conjunctivae and EOM are normal. Pupils are equal, round, and reactive to light. No scleral icterus.  Neck: Normal range of motion. Neck supple. No JVD present. No thyromegaly present.  Cardiovascular: Normal rate, regular rhythm and normal heart sounds.  No murmur heard. No BLE edema. Pulmonary/Chest: Effort normal and breath sounds normal. No respiratory distress. Abdominal: Soft. Bowel sounds are normal, no distension. There is no tenderness. no masses Breast: no lumps or masses, no nipple discharge or rashes FEMALE GENITALIA:  External genitalia normal External urethra normal Vaginal vault normal without discharge or lesions Cervix normal without discharge or lesions Bimanual exam normal without masses RECTAL: no rectal masses or hemorrhoids Musculoskeletal: Normal range of motion, no joint effusions. No gross deformities Neurological: he is alert and oriented to person, place, and time. No cranial nerve deficit. Coordination, balance, strength, speech and gait are normal.  Skin: Skin is warm and dry. No rash noted. No erythema.  Psychiatric: Patient has a normal mood and affect. behavior is normal. Judgment and thought content normal.  Diabetic Foot Exam - Simple   Simple Foot Form Diabetic Foot exam was performed with the following findings: Yes 01/07/2023  9:23 AM  Visual Inspection No deformities, no ulcerations, no other skin breakdown bilaterally: Yes Sensation Testing Intact to touch and monofilament testing bilaterally: Yes Pulse Check Posterior Tibialis and Dorsalis pulse intact bilaterally: Yes Comments     Recent Results (from the past 2160 hour(s))  POCT Glucose (Device for Home Use)     Status: Abnormal   Collection Time: 11/17/22  9:54 AM  Result Value Ref Range   Glucose Fasting, POC     POC Glucose 199 (A) 70 - 99 mg/dl  POCT glycosylated hemoglobin (Hb A1C)     Status: Abnormal   Collection Time: 11/17/22  9:59 AM  Result Value Ref Range   Hemoglobin A1C 6.9  (A) 4.0 - 5.6 %   HbA1c POC (<> result, manual entry)     HbA1c, POC (prediabetic range)     HbA1c, POC (controlled diabetic range)    Comprehensive metabolic panel     Status: Abnormal   Collection Time: 11/17/22 10:29 AM  Result Value Ref Range   Glucose, Bld 182 (H) 65 - 99 mg/dL    Comment: .            Fasting reference interval . For someone without known diabetes, a glucose value >125 mg/dL indicates that they may have diabetes and this should be confirmed with a follow-up test. .    BUN 8 7 - 25 mg/dL   Creat 1.61 (L) 0.96 - 0.96 mg/dL   BUN/Creatinine Ratio 21 6 - 22 (calc)   Sodium 137 135 - 146 mmol/L   Potassium 3.9 3.5 - 5.3 mmol/L   Chloride 104 98 - 110 mmol/L   CO2 22 20 - 32 mmol/L   Calcium 9.2 8.6 - 10.2 mg/dL   Total Protein 7.3 6.1 - 8.1 g/dL   Albumin 4.2 3.6 - 5.1 g/dL   Globulin 3.1 1.9 - 3.7  g/dL (calc)   AG Ratio 1.4 1.0 - 2.5 (calc)   Total Bilirubin 0.7 0.2 - 1.2 mg/dL   Alkaline phosphatase (APISO) 93 31 - 125 U/L   AST 13 10 - 30 U/L   ALT 20 6 - 29 U/L  Lipid panel     Status: Abnormal   Collection Time: 11/17/22 10:29 AM  Result Value Ref Range   Cholesterol 145 <200 mg/dL   HDL 43 (L) > OR = 50 mg/dL   Triglycerides 82 <102 mg/dL   LDL Cholesterol (Calc) 85 mg/dL (calc)    Comment: Reference range: <100 . Desirable range <100 mg/dL for primary prevention;   <70 mg/dL for patients with CHD or diabetic patients  with > or = 2 CHD risk factors. Marland Kitchen LDL-C is now calculated using the Martin-Hopkins  calculation, which is a validated novel method providing  better accuracy than the Friedewald equation in the  estimation of LDL-C.  Horald Pollen et al. Lenox Ahr. 7253;664(40): 2061-2068  (http://education.[facility].com/faq/FAQ164)    Total CHOL/HDL Ratio 3.4 <5.0 (calc)   Non-HDL Cholesterol (Calc) 102 <130 mg/dL (calc)    Comment: For patients with diabetes plus 1 major ASCVD risk  factor, treating to a non-HDL-C goal of <100 mg/dL   (LDL-C of <34 mg/dL) is considered a therapeutic  option.   Microalbumin / creatinine urine ratio     Status: None   Collection Time: 11/17/22 10:29 AM  Result Value Ref Range   Creatinine, Urine 227 20 - 275 mg/dL   Microalb, Ur 0.9 mg/dL    Comment: Reference Range Not established    Microalb Creat Ratio 4 <30 mg/g creat    Comment: . The ADA defines abnormalities in albumin excretion as follows: Marland Kitchen Albuminuria Category        Result (mg/g creatinine) . Normal to Mildly increased   <30 Moderately increased         30-299  Severely increased           > OR = 300 . The ADA recommends that at least two of three specimens collected within a 3-6 month period be abnormal before considering a patient to be within a diagnostic category.   TSH     Status: None   Collection Time: 11/17/22 10:29 AM  Result Value Ref Range   TSH 1.96 mIU/L    Comment:           Reference Range .           > or = 20 Years  0.40-4.50 .                Pregnancy Ranges           First trimester    0.26-2.66           Second trimester   0.55-2.73           Third trimester    0.43-2.91   T4, free     Status: None   Collection Time: 11/17/22 10:29 AM  Result Value Ref Range   Free T4 1.1 0.8 - 1.8 ng/dL  Celiac Disease Comprehensive Panel with Reflexes     Status: None   Collection Time: 11/17/22 10:29 AM  Result Value Ref Range   INTERPRETATION      Comment: . No serological evidence of celiac disease. Marland Kitchen tTG IgA may normalize in individuals with celiac disease who maintain a gluten-free diet. Consider HLA DQ2 and  DQ8 testing to rule out celiac disease. Celiac  disease  is extremely rare in the absence of DQ2 or DQ8. .    (tTG) Ab, IgA 2.3 U/mL    Comment: Value          Interpretation -----          -------------- <15.0          Antibody not detected > or = 15.0    Antibody detected .    Immunoglobulin A 267 47 - 310 mg/dL      Fall Risk:    12/09/4313    8:54 AM  Fall Risk    Falls in the past year? 0  Number falls in past yr: 0  Injury with Fall? 0     Functional Status Survey: Is the patient deaf or have difficulty hearing?: No Does the patient have difficulty seeing, even when wearing glasses/contacts?: No Does the patient have difficulty concentrating, remembering, or making decisions?: No Does the patient have difficulty walking or climbing stairs?: No Does the patient have difficulty dressing or bathing?: No Does the patient have difficulty doing errands alone such as visiting a doctor's office or shopping?: No   Assessment & Plan  1. Annual physical exam -recently had labs at endo - Cytology - PAP  2. Need for Tdap vaccination  - Tdap vaccine greater than or equal to 7yo IM  3. Cervical cancer screening  - Cytology - PAP  4. Encounter to establish care   Recently had labs at endo  -USPSTF grade A and B recommendations reviewed with patient; age-appropriate recommendations, preventive care, screening tests, etc discussed and encouraged; healthy living encouraged; see AVS for patient education given to patient -Discussed importance of 150 minutes of physical activity weekly, eat two servings of fish weekly, eat one serving of tree nuts ( cashews, pistachios, pecans, almonds.Marland Kitchen) every other day, eat 6 servings of fruit/vegetables daily and drink plenty of water and avoid sweet beverages.   -Reviewed Health Maintenance: yes

## 2023-01-07 ENCOUNTER — Encounter: Payer: Self-pay | Admitting: Nurse Practitioner

## 2023-01-07 ENCOUNTER — Other Ambulatory Visit (HOSPITAL_COMMUNITY)
Admission: RE | Admit: 2023-01-07 | Discharge: 2023-01-07 | Disposition: A | Discharge status: Home / Self Care | Payer: Medicaid Other | Source: Ambulatory Visit | Attending: Nurse Practitioner | Admitting: Nurse Practitioner

## 2023-01-07 ENCOUNTER — Other Ambulatory Visit: Payer: Self-pay

## 2023-01-07 ENCOUNTER — Ambulatory Visit: Payer: Medicaid Other | Admitting: Nurse Practitioner

## 2023-01-07 VITALS — BP 120/70 | Temp 98.0°F | Resp 18 | Ht 62.0 in | Wt 172.0 lb

## 2023-01-07 DIAGNOSIS — Z0000 Encounter for general adult medical examination without abnormal findings: Secondary | ICD-10-CM | POA: Diagnosis not present

## 2023-01-07 DIAGNOSIS — Z124 Encounter for screening for malignant neoplasm of cervix: Secondary | ICD-10-CM | POA: Insufficient documentation

## 2023-01-07 DIAGNOSIS — Z7689 Persons encountering health services in other specified circumstances: Secondary | ICD-10-CM

## 2023-01-07 DIAGNOSIS — Z23 Encounter for immunization: Secondary | ICD-10-CM

## 2023-01-13 ENCOUNTER — Encounter (INDEPENDENT_AMBULATORY_CARE_PROVIDER_SITE_OTHER): Payer: Self-pay

## 2023-01-13 LAB — CYTOLOGY - PAP
Chlamydia: NEGATIVE
Comment: NEGATIVE
Comment: NORMAL
Diagnosis: NEGATIVE
Neisseria Gonorrhea: NEGATIVE

## 2023-01-31 ENCOUNTER — Other Ambulatory Visit: Payer: Self-pay

## 2023-02-09 ENCOUNTER — Ambulatory Visit: Payer: Medicaid Other | Admitting: Endocrinology

## 2023-02-09 ENCOUNTER — Encounter: Payer: Self-pay | Admitting: Endocrinology

## 2023-02-09 VITALS — BP 110/72 | HR 96 | Ht 62.0 in | Wt 174.0 lb

## 2023-02-09 DIAGNOSIS — E1169 Type 2 diabetes mellitus with other specified complication: Secondary | ICD-10-CM | POA: Diagnosis not present

## 2023-02-09 DIAGNOSIS — Z794 Long term (current) use of insulin: Secondary | ICD-10-CM | POA: Diagnosis not present

## 2023-02-09 LAB — POCT GLYCOSYLATED HEMOGLOBIN (HGB A1C): Hemoglobin A1C: 7.7 % — AB (ref 4.0–5.6)

## 2023-02-09 MED ORDER — EMPAGLIFLOZIN 25 MG PO TABS
25.0000 mg | ORAL_TABLET | Freq: Every day | ORAL | 3 refills | Status: DC
Start: 2023-02-09 — End: 2023-02-23

## 2023-02-09 NOTE — Progress Notes (Signed)
Outpatient Endocrinology Note [place] Elasia Furnish, MD  02/09/23  Patient's Name: Holly [name]    DOB: 2000/10/16    MRN: [id number]                                                    REASON OF VISIT: New consult for evaluation of type 2 diabetes mellitus  REFERRING PROVIDER: Dessa Phi, MD  PCP: Berniece Salines, FNP  HISTORY OF PRESENT ILLNESS:   Holly [name] is a 22 y.o. old female with past medical history listed below, is here for evaluation of type 2 diabetes mellitus.   Pertinent Diabetes History: Patient was diagnosed with type 2 diabetes mellitus, initially in June 2018, at the age of 16 years, regular blood work revealed hemoglobin A1c of 12%, at that time she was in [place] for vacation, PCP asked to go to ER due to elevated hemoglobin A1c, she went to [facility] and had her initial evaluation, she had negative GAD 65, IA-2 and insulin antibodies, records scanned into media and reviewed.  She was started on insulin therapy after the diagnosis with Lantus and NovoLog.  She was following with pediatric endocrinology, and presented today to establish adult endocrinology care for the diabetes.  Hemoglobin A1c at the time of diagnosis was 12.4%.  History of DKA or diabetes related hospitalizations: none  Previous diabetes education: Yes   Family h/o diabetes mellitus: mother and aunt type 2 diabetes.   Chronic Diabetes Complications : Retinopathy: no. Last ophthalmology exam was done on annually.  Nephropathy: no Peripheral neuropathy: no Coronary artery disease: no Stroke: no  Relevant comorbidities and cardiovascular risk factors: Obesity: yes Body mass index is 31.83 kg/m.  Hypertension: no Hyperlipidemia. yes  Current / Home Diabetic regimen includes: Tresiba 72 units at bedtime.  Mounjaro 15 mg weekly.   Prior diabetic medications: Metformin extended release , discontinued due to missing pills.  Denies side  effects. Trulicity in the past.   Glycemic data:    CONTINUOUS GLUCOSE MONITORING SYSTEM (CGMS) INTERPRETATION: At today's visit, we reviewed CGM downloads. The full report is scanned in the media. Reviewing the CGM trends, blood glucose are as follows:  Dexcom G7 CGM-  Sensor Download (Sensor download was reviewed and summarized below.) Dates: August 1 to February 09, 2019 for 14 days.  Glucose Management Indicator: n/a% Sensor Average: 181 SD 49  Glycemic Trends:  <54: 0% 54-70: 0% 71-180: 56% 181-250: 35% 251-400: 9%  Interpretation: -Variable blood sugar.  Some of the days acceptable blood sugar in the normal range.  Some of the days mostly postprandial hyperglycemia, at times up to 300 range, mainly related with supper/late eating at bedtime and sometimes with lunch.  Overnight blood sugar mostly acceptable as long as blood sugar are acceptable at bedtime.  Sometimes hyperglycemia in the early night related with hyperglycemia at bedtime.  No hypoglycemia.  Hypoglycemia: Patient has no hypoglycemic episodes. Patient has hypoglycemia awareness.  Factors modifying glucose control: 1.  Diabetic diet assessment: 3 meals a day, sometimes eats late around bedtime.  2.  Staying active or exercising: hike / walking / 3 x a week.   3.  Medication compliance: compliant all of the time.  Interval history 02/09/23 She has been taking Tresiba 72 units at bedtime and Mounjaro 15 mg weekly.  Due to backorder she was taking  lower dose of Mounjaro 12.5 mg prior to increasing dose to 15 mg about 4 weeks ago.  She has occasional diarrhea, constipation and burping of the gas, however tolerable.  Discussed about lowering dose of Mounjaro versus watching the GI symptoms, she preferred to stay on the current dose of Mounjaro.  Hemoglobin A1c 7.7% today is elevated compared to prior level.  She denies vision problem.  No numbness and tingling of the feet.  Dexcom G7 CGM data as reviewed above.  No  other complaints today.  REVIEW OF SYSTEMS As per history of present illness.   PAST MEDICAL HISTORY: Past Medical History:  Diagnosis Date   Diabetes mellitus without complication (HCC)    Mild acne 12/04/2013   Obesity, unspecified 12/04/2013    PAST SURGICAL HISTORY: Past Surgical History:  Procedure Laterality Date   MOUTH SURGERY      ALLERGIES: No Known Allergies  FAMILY HISTORY:  Family History  Problem Relation Age of Onset   Diabetes Mother     SOCIAL HISTORY: Social History   Socioeconomic History   Marital status: Single    Spouse name: Not on file   Number of children: Not on file   Years of education: Not on file   Highest education level: Not on file  Occupational History   Not on file  Tobacco Use   Smoking status: Never    Passive exposure: Never   Smokeless tobacco: Never  Vaping Use   Vaping status: Never Used  Substance and Sexual Activity   Alcohol use: Never   Drug use: Never   Sexual activity: Not Currently  Other Topics Concern   Not on file  Social History Narrative   No College. Not working right now   Social Determinants of Health   Financial Resource Strain: Low Risk  (01/07/2023)   Overall Financial Resource Strain (CARDIA)    Difficulty of Paying Living Expenses: Not hard at all  Food Insecurity: Low Risk  (10/28/2022)   [facility]   Food vital sign    Within the past 12 months, you worried that your food would run out before you got money to buy more: Never true    Within the past 12 months, the food you bought just didn't last and you didn't have money to get more. : Never true  Transportation Needs: No Transportation Needs (10/28/2022)   [facility]   Transportation    In the past 12 months, has lack of reliable transportation kept you from medical appointments, meetings, work or from getting things needed for daily living? : No  Physical Activity: Sufficiently  Active (01/07/2023)   Exercise Vital Sign    Days of Exercise per Week: 5 days    Minutes of Exercise per Session: 30 min  Stress: No Stress Concern Present (01/07/2023)   [employer] of Occupational Health - Occupational Stress Questionnaire    Feeling of Stress : Not at all  Social Connections: Moderately Integrated (01/07/2023)   Social Connection and Isolation Panel [NHANES]    Frequency of Communication with Friends and Family: More than three times a week    Frequency of Social Gatherings with Friends and Family: More than three times a week    Attends Religious Services: More than 4 times per year    Active Member of [employer] or Organizations: Yes    Attends Banker Meetings: More than 4 times per year    Marital Status: Never  married    MEDICATIONS:  Current Outpatient Medications  Medication Sig Dispense Refill   Accu-Chek Softclix Lancets lancets Use to check sugar 6 times daily. 200 each 5   Ascorbic Acid (VITAMIN C ADULT GUMMIES PO) Take by mouth.     Blood Glucose Monitoring Suppl (ACCU-CHEK GUIDE) w/Device KIT Use to check sugars up to 6 times daily. 1 kit 0   Continuous Blood Gluc Receiver (DEXCOM G6 RECEIVER) DEVI 1 each by Does not apply route as directed. 1 each 1   Continuous Blood Gluc Transmit (DEXCOM G6 TRANSMITTER) MISC 1 EACH BUT DOES NOT APPLY ROUTE EVERY 3 MONTHS. 1 each 3   Continuous Glucose Sensor (DEXCOM G6 SENSOR) MISC USE 1 SENSOR EVERY 10 DAYS AS DIRECTED 3 each 5   Continuous Glucose Transmitter (DEXCOM G6 TRANSMITTER) MISC 1 each by Does not apply route every 3 (three) months. 1 each 3   empagliflozin (JARDIANCE) 25 MG TABS tablet Take 1 tablet (25 mg total) by mouth daily before breakfast. 90 tablet 3   Glucagon (BAQSIMI TWO PACK) 3 MG/DOSE POWD Place 1 each into the nose as needed (severe hypoglycmia with unresponsiveness). 1 each 3   Insulin Pen Needle (BD PEN NEEDLE NANO 2ND GEN) 32G X 4 MM MISC USE TO INJECT INSULIN 6 TIMES DAILY 200  each 5   tirzepatide (MOUNJARO) 15 MG/0.5ML Pen Inject 15 mg into the skin once a week. 2 mL 5   Clindamycin-Benzoyl Per, Refr, gel Apply 1 application  topically 2 (two) times daily. (Patient not taking: Reported on 02/09/2023) 45 g 1   glucose blood (ACCU-CHEK GUIDE) test strip Use as directed 6 times daily. (Patient not taking: Reported on 01/07/2023) 200 each 5   insulin degludec (TRESIBA FLEXTOUCH) 200 UNIT/ML FlexTouch Pen Inject up to 120 units daily as directed by physician (Patient taking differently: Inject up to 120 units daily as directed by physician Currently at 70 units) 30 mL 11   No current facility-administered medications for this visit.    PHYSICAL EXAM: Vitals:   02/09/23 1000  BP: 110/72  Pulse: 96  SpO2: 99%  Weight: 174 lb (78.9 kg)  Height: 5\' 2"  (1.575 m)   Body mass index is 31.83 kg/m.  Wt Readings from Last 3 Encounters:  02/09/23 174 lb (78.9 kg)  01/07/23 172 lb (78 kg)  11/17/22 168 lb 6.9 oz (76.4 kg)    General: Well developed, well nourished female in no apparent distress.  HEENT: AT/[place], no external lesions.  Eyes: Conjunctiva clear and no icterus. Neck: Neck supple  Lungs: Respirations not labored Neurologic: Alert, oriented, normal speech Extremities / Skin: Dry. No sores or rashes noted. No acanthosis nigricans Psychiatric: Does not appear depressed or anxious  Diabetic Foot Exam - Simple   No data filed     LABS Reviewed Lab Results  Component Value Date   HGBA1C 7.7 (A) 02/09/2023   HGBA1C 6.9 (A) 11/17/2022   HGBA1C 7.1 (A) 04/20/2022   No results found for: "FRUCTOSAMINE" Lab Results  Component Value Date   CHOL 145 11/17/2022   HDL 43 (L) 11/17/2022   LDLCALC 85 11/17/2022   TRIG 82 11/17/2022   CHOLHDL 3.4 11/17/2022   Lab Results  Component Value Date   MICRALBCREAT 4 11/17/2022   Lab Results  Component Value Date   CREATININE 0.39 (L) 11/17/2022   No results found for: "GFR"  ASSESSMENT / PLAN  1. Type 2  diabetes mellitus with other specified complication, with long-term current use of  insulin (HCC)     Diabetes Mellitus type 2, complicated by no known complications - Diabetic status / severity: uncontrolled.   Lab Results  Component Value Date   HGBA1C 7.7 (A) 02/09/2023    - Hemoglobin A1c goal : <7%  Discussed about diabetes and potential chronic complications and importance of controlling blood sugar.  Patient has mainly postprandial hyperglycemia at times.  Discussed about portion control and limiting carbohydrate in the meal.  Discussed about adding SGLT2 inhibitor/Jardiance.  She has no contraindication.  She rarely gets vaginal yeast infection when blood sugar is high however is not a regular problem for her.  Discussed that Jardiance can increase her risks of yeast infection if she develop any problem asked call our clinic.  We need to stop the Jardiance at that time.  - Medications: Adjusted diabetes regimen as follows.  I) continue Tresiba 72 units at bedtime. II) continue Mounjaro 15 mg weekly. III) start Jardiance 25 mg daily.  Check BMP in 2 weeks to monitor renal function after being on Jardiance.  - Home glucose testing: Continue Dexcom G7 CGM and check blood sugar as needed. - Discussed/ Gave Hypoglycemia treatment plan.  # Consult : not required at this time.   # Annual urine for microalbuminuria/ creatinine ratio, no microalbuminuria currently. Last  Lab Results  Component Value Date   MICRALBCREAT 4 11/17/2022    # Foot check nightly.  # Annual dilated diabetic eye exams.   - Diet: Make healthy diabetic food choices - Life style / activity / exercise: Discussed.  2. Blood pressure  -  BP Readings from Last 1 Encounters:  02/09/23 110/72    - Control is in target.  - No change in current plans.  3. Lipid status / Hyperlipidemia - Last  Lab Results  Component Value Date   LDLCALC 85 11/17/2022   -Statin is not indicated at this  time.  Holly [name] was seen today for establish care.  Diagnoses and all orders for this visit:  Type 2 diabetes mellitus with other specified complication, with long-term current use of insulin (HCC) -     POCT glycosylated hemoglobin (Hb A1C) -     Basic Metabolic Panel (BMET); Future  Other orders -     empagliflozin (JARDIANCE) 25 MG TABS tablet; Take 1 tablet (25 mg total) by mouth daily before breakfast.   DISPOSITION Follow up in clinic in 3 months suggested.   All questions answered and patient verbalized understanding of the plan.  [place] Joeseph Verville, MD [facility] [address] [place], [address] [place], [place] [address] Phone # [phone]  At least part of this note was generated using voice recognition software. Inadvertent word errors may have occurred, which were not recognized during the proofreading process.

## 2023-02-09 NOTE — Patient Instructions (Signed)
Diabetes regimen:  Tresiba 72 units at bedtime. Mounjaro 15 mg weekly. Start jardiance 25mg  daily.   You have lab in 2 weeks.

## 2023-02-10 ENCOUNTER — Encounter: Payer: Self-pay | Admitting: Endocrinology

## 2023-02-16 ENCOUNTER — Other Ambulatory Visit (HOSPITAL_BASED_OUTPATIENT_CLINIC_OR_DEPARTMENT_OTHER): Payer: Self-pay

## 2023-02-16 ENCOUNTER — Other Ambulatory Visit (INDEPENDENT_AMBULATORY_CARE_PROVIDER_SITE_OTHER): Payer: Self-pay | Admitting: Pediatric Endocrinology

## 2023-02-16 DIAGNOSIS — E1165 Type 2 diabetes mellitus with hyperglycemia: Secondary | ICD-10-CM

## 2023-02-16 DIAGNOSIS — E119 Type 2 diabetes mellitus without complications: Secondary | ICD-10-CM

## 2023-02-17 ENCOUNTER — Other Ambulatory Visit (HOSPITAL_BASED_OUTPATIENT_CLINIC_OR_DEPARTMENT_OTHER): Payer: Self-pay

## 2023-02-17 ENCOUNTER — Encounter: Payer: Self-pay | Admitting: Endocrinology

## 2023-02-17 ENCOUNTER — Other Ambulatory Visit: Payer: Self-pay

## 2023-02-17 DIAGNOSIS — E669 Obesity, unspecified: Secondary | ICD-10-CM

## 2023-02-17 DIAGNOSIS — E1165 Type 2 diabetes mellitus with hyperglycemia: Secondary | ICD-10-CM

## 2023-02-17 DIAGNOSIS — Z794 Long term (current) use of insulin: Secondary | ICD-10-CM

## 2023-02-17 MED ORDER — DEXCOM G6 TRANSMITTER MISC
3 refills | Status: DC
Start: 2023-02-17 — End: 2024-02-01

## 2023-02-17 MED ORDER — DEXCOM G6 RECEIVER DEVI: 1.0000 | 1 refills | Status: DC

## 2023-02-18 ENCOUNTER — Other Ambulatory Visit (HOSPITAL_COMMUNITY): Payer: Self-pay

## 2023-02-18 ENCOUNTER — Telehealth: Payer: Self-pay

## 2023-02-18 NOTE — Telephone Encounter (Signed)
Pt needs a PA for Jardiance 25 mg

## 2023-02-18 NOTE — Telephone Encounter (Signed)
Pharmacy Patient Advocate Encounter   Received notification from Pt Calls Messages that prior authorization for Jardiance is required/requested.   Insurance verification completed.   The patient is insured through [facility] .   Per test claim:  glipizide is preferred by the insurance.  If suggested medication is appropriate, Please send in a new RX and discontinue this one. If not, please advise as to why it's not appropriate so that we may request a Prior Authorization.

## 2023-02-21 ENCOUNTER — Other Ambulatory Visit (HOSPITAL_BASED_OUTPATIENT_CLINIC_OR_DEPARTMENT_OTHER): Payer: Self-pay

## 2023-02-21 ENCOUNTER — Other Ambulatory Visit (INDEPENDENT_AMBULATORY_CARE_PROVIDER_SITE_OTHER): Payer: Self-pay | Admitting: Pediatric Endocrinology

## 2023-02-21 DIAGNOSIS — E119 Type 2 diabetes mellitus without complications: Secondary | ICD-10-CM

## 2023-02-21 DIAGNOSIS — E1165 Type 2 diabetes mellitus with hyperglycemia: Secondary | ICD-10-CM

## 2023-02-21 DIAGNOSIS — E669 Obesity, unspecified: Secondary | ICD-10-CM

## 2023-02-22 ENCOUNTER — Other Ambulatory Visit (HOSPITAL_BASED_OUTPATIENT_CLINIC_OR_DEPARTMENT_OTHER): Payer: Self-pay

## 2023-02-22 ENCOUNTER — Ambulatory Visit (INDEPENDENT_AMBULATORY_CARE_PROVIDER_SITE_OTHER): Payer: Medicaid Other | Admitting: Pediatric Endocrinology

## 2023-02-22 ENCOUNTER — Other Ambulatory Visit (INDEPENDENT_AMBULATORY_CARE_PROVIDER_SITE_OTHER): Payer: Self-pay | Admitting: Pediatric Endocrinology

## 2023-02-22 DIAGNOSIS — E119 Type 2 diabetes mellitus without complications: Secondary | ICD-10-CM

## 2023-02-22 MED ORDER — ACCU-CHEK GUIDE VI STRP: ORAL_STRIP | 5 refills | Status: DC

## 2023-02-22 MED ORDER — ACCU-CHEK FASTCLIX LANCETS MISC: 5 refills | Status: AC

## 2023-02-22 MED ORDER — TRESIBA FLEXTOUCH 200 UNIT/ML ~~LOC~~ SOPN
PEN_INJECTOR | SUBCUTANEOUS | 3 refills | Status: DC
Start: 2023-02-22 — End: 2023-10-19

## 2023-02-22 MED ORDER — ACCU-CHEK GUIDE W/DEVICE KIT: PACK | 0 refills | Status: AC

## 2023-02-22 NOTE — Telephone Encounter (Addendum)
Spoke with the patient and the refills have been sent.   Requested Prescriptions   Signed Prescriptions Disp Refills   insulin degludec (TRESIBA FLEXTOUCH) 200 UNIT/ML FlexTouch Pen 36 mL 3    Sig: Inject 72 units daily    Authorizing Provider: Erroll Luna, [place]    Ordering User: Blaise Grieshaber S   Blood Glucose Monitoring Suppl (ACCU-CHEK GUIDE) w/Device KIT 1 kit 0    Sig: Use to check sugars up to 3 times daily.    Authorizing Provider: THAPA, [place]    Ordering User: Sorrel Cassetta S   glucose blood (ACCU-CHEK GUIDE) test strip 300 each 5    Sig: Use as directed 3 times daily.    Authorizing Provider: THAPA, [place]    Ordering User: Hyacinth Meeker, Torry Adamczak S   Accu-Chek FastClix Lancets MISC 300 each 5    Sig: Use to check blood sugar 3 times a day    Authorizing Provider: THAPA, [place]    Ordering User: Pollie Meyer

## 2023-02-23 ENCOUNTER — Other Ambulatory Visit: Payer: Self-pay | Admitting: Endocrinology

## 2023-02-23 MED ORDER — GLIPIZIDE ER 5 MG PO TB24: 5.0000 mg | ORAL_TABLET | Freq: Every day | ORAL | 4 refills | Status: DC

## 2023-02-24 ENCOUNTER — Other Ambulatory Visit (HOSPITAL_COMMUNITY): Payer: Self-pay

## 2023-02-24 ENCOUNTER — Encounter: Payer: Self-pay | Admitting: Nurse Practitioner

## 2023-02-24 ENCOUNTER — Other Ambulatory Visit: Payer: Self-pay

## 2023-02-24 ENCOUNTER — Other Ambulatory Visit (INDEPENDENT_AMBULATORY_CARE_PROVIDER_SITE_OTHER): Payer: Medicaid Other

## 2023-02-24 ENCOUNTER — Ambulatory Visit: Payer: Medicaid Other | Admitting: Nurse Practitioner

## 2023-02-24 DIAGNOSIS — L732 Hidradenitis suppurativa: Secondary | ICD-10-CM

## 2023-02-24 DIAGNOSIS — E1169 Type 2 diabetes mellitus with other specified complication: Secondary | ICD-10-CM | POA: Diagnosis not present

## 2023-02-24 DIAGNOSIS — Z794 Long term (current) use of insulin: Secondary | ICD-10-CM

## 2023-02-24 DIAGNOSIS — E119 Type 2 diabetes mellitus without complications: Secondary | ICD-10-CM | POA: Diagnosis not present

## 2023-02-24 LAB — BASIC METABOLIC PANEL
BUN: 10 mg/dL (ref 6–23)
CO2: 25 mEq/L (ref 19–32)
Calcium: 9.1 mg/dL (ref 8.4–10.5)
Chloride: 103 mEq/L (ref 96–112)
Creatinine, Ser: 0.48 mg/dL (ref 0.40–1.20)
GFR: 135.52 mL/min (ref >60.00)
Glucose, Bld: 131 mg/dL — ABNORMAL HIGH (ref 70–99)
Potassium: 3.6 mEq/L (ref 3.5–5.1)
Sodium: 137 mEq/L (ref 135–145)

## 2023-02-24 MED ORDER — CLINDAMYCIN PHOS-BENZOYL PEROX 1.2-5 % EX GEL
1.0000 | Freq: Two times a day (BID) | CUTANEOUS | 1 refills | Status: DC
  Filled 2023-02-24: qty 45, 30d supply, fill #0
  Filled 2023-04-02: qty 45, 30d supply, fill #1

## 2023-02-24 MED ORDER — MOUNJARO 15 MG/0.5ML ~~LOC~~ SOAJ
15.0000 mg | SUBCUTANEOUS | 5 refills | Status: DC
Start: 2023-02-24 — End: 2023-08-15
  Filled 2023-02-24: qty 2, 28d supply, fill #0
  Filled 2023-03-27: qty 2, 28d supply, fill #1
  Filled 2023-04-25: qty 2, 28d supply, fill #2
  Filled 2023-05-23: qty 2, 28d supply, fill #3
  Filled 2023-06-20: qty 2, 28d supply, fill #4
  Filled 2023-07-15: qty 2, 28d supply, fill #5

## 2023-02-24 MED ORDER — DEXCOM G6 SENSOR MISC
10 refills | Status: DC
Start: 2023-02-24 — End: 2024-02-01
  Filled 2023-02-24 – 2023-03-07 (×4): qty 3, 30d supply, fill #0
  Filled 2023-04-02: qty 3, 30d supply, fill #1
  Filled 2023-05-02: qty 3, 30d supply, fill #2
  Filled 2023-06-01: qty 3, 30d supply, fill #3
  Filled 2023-07-02: qty 3, 30d supply, fill #4
  Filled 2023-08-01: qty 3, 30d supply, fill #5
  Filled 2023-08-31: qty 3, 30d supply, fill #6
  Filled 2023-09-27 – 2023-10-17 (×7): qty 3, 30d supply, fill #7

## 2023-02-24 MED ORDER — DEXCOM G6 TRANSMITTER MISC
1.0000 | 3 refills | Status: DC
Start: 2023-02-24 — End: 2023-10-10
  Filled 2023-02-24 – 2023-03-07 (×4): qty 1, 90d supply, fill #0
  Filled 2023-06-01: qty 1, 90d supply, fill #1
  Filled 2023-08-31: qty 1, 90d supply, fill #2

## 2023-02-24 NOTE — Assessment & Plan Note (Signed)
Can do warm compresses.  Refill of clindamycin sent in.

## 2023-02-24 NOTE — Assessment & Plan Note (Signed)
Having trouble getting refills from her endocrinologist.  Refill sent of mounjaro and dexcom 6.

## 2023-02-24 NOTE — Progress Notes (Signed)
BP 110/72   Pulse 98   Temp 98.2 F (36.8 C) (Oral)   Resp 16   Ht 5\' 2"  (1.575 m)   Wt 176 lb 9.6 oz (80.1 kg)   LMP 02/24/2023   SpO2 99%   BMI 32.30 kg/m    Subjective:    Patient ID: Holly [name], female    DOB: 2000/08/01, 22 y.o.   MRN: [id number]  HPI: Holly [name] is a 22 y.o. female  Chief Complaint  Patient presents with   Referral    To Endocrinology   Diabetes    Discuss medication issues   Diabetes, Type 2:  -managed by endocrinology, last seen on 02/09/2023 -Last A1c 7.7 -Medications: tresiba 72 units daily, mounjaro 12.5 mg weekly, glipizide 5 mg daily -Patient is compliant with the above medications and reports no side effects.  -Checking BG at home: yes dexcom 6, but has been unable to get dexcom 6 lately. She says she has been having trouble getting her refills. She says she needs a refill on the dexcom and mounjaro.  Refills sent.   -Fasting home BG: 90-210 -Eye exam: utd -Foot exam: utd -Microalbumin: utd -Statin: no -Denies symptoms of hypoglycemia, polyuria, polydipsia, numbness extremities, foot ulcers/trauma.   -patient frustrated with not being able to get her refills, debating on if she needs to find another endocrinologsit. Discussed with patient that sometimes you have to work through the kinks before everything falls in place. Recommend staying where she is at for now if no improvement will refer her somewhere else. Sent refills for mounjaro and Dexcom 6.   HS: she reports that she has some HS on her chest.  Quarter size spot on left breast.  Can do warm compresses. Refill sent of clindamycin.    Relevant past medical, surgical, family and social history reviewed and updated as indicated. Interim medical history since our last visit reviewed. Allergies and medications reviewed and updated.  Review of Systems  Constitutional: Negative for fever or weight change.  Respiratory: Negative for cough and shortness of  breath.   Cardiovascular: Negative for chest pain or palpitations.  Gastrointestinal: Negative for abdominal pain, no bowel changes.  Musculoskeletal: Negative for gait problem or joint swelling.  Skin: Negative for rash. Positive for HS Neurological: Negative for dizziness or headache.  No other specific complaints in a complete review of systems (except as listed in HPI above).      Objective:    BP 110/72   Pulse 98   Temp 98.2 F (36.8 C) (Oral)   Resp 16   Ht 5\' 2"  (1.575 m)   Wt 176 lb 9.6 oz (80.1 kg)   LMP 02/24/2023   SpO2 99%   BMI 32.30 kg/m   Wt Readings from Last 3 Encounters:  02/24/23 176 lb 9.6 oz (80.1 kg)  02/09/23 174 lb (78.9 kg)  01/07/23 172 lb (78 kg)    Physical Exam  Constitutional: Patient appears well-developed and well-nourished. Obese  No distress.  HEENT: head atraumatic, normocephalic, pupils equal and reactive to light, neck supple, throat within normal limits Cardiovascular: Normal rate, regular rhythm and normal heart sounds.  No murmur heard. No BLE edema. Pulmonary/Chest: Effort normal and breath sounds normal. No respiratory distress. Abdominal: Soft.  There is no tenderness. Skin: quarter size skin abscess on breast Psychiatric: Patient has a normal mood and affect. behavior is normal. Judgment and thought content normal.  Results for orders placed or performed in visit on 02/24/23  Basic Metabolic Panel (  BMET)  Result Value Ref Range   Sodium 137 135 - 145 mEq/L   Potassium 3.6 3.5 - 5.1 mEq/L   Chloride 103 96 - 112 mEq/L   CO2 25 19 - 32 mEq/L   Glucose, Bld 131 (H) 70 - 99 mg/dL   BUN 10 6 - 23 mg/dL   Creatinine, Ser 1.61 0.40 - 1.20 mg/dL   GFR 096.04 >54.09 mL/min   Calcium 9.1 8.4 - 10.5 mg/dL      Assessment & Plan:   Problem List Items Addressed This Visit       Endocrine   Type 2 diabetes mellitus treated with insulin (HCC)    Having trouble getting refills from her endocrinologist.  Refill sent of mounjaro and  dexcom 6.      Relevant Medications   Continuous Glucose Sensor (DEXCOM G6 SENSOR) MISC   Continuous Glucose Transmitter (DEXCOM G6 TRANSMITTER) MISC   tirzepatide (MOUNJARO) 15 MG/0.5ML Pen     Musculoskeletal and Integument   Hidradenitis suppurativa    Can do warm compresses.  Refill of clindamycin sent in.       Relevant Medications   Clindamycin-Benzoyl Per, Refr, gel     Follow up plan: Return if symptoms worsen or fail to improve.

## 2023-03-02 ENCOUNTER — Other Ambulatory Visit: Payer: Self-pay

## 2023-03-02 ENCOUNTER — Other Ambulatory Visit (HOSPITAL_COMMUNITY): Payer: Self-pay

## 2023-03-03 ENCOUNTER — Other Ambulatory Visit (HOSPITAL_COMMUNITY): Payer: Self-pay

## 2023-03-03 ENCOUNTER — Ambulatory Visit (HOSPITAL_COMMUNITY)
Admission: EM | Admit: 2023-03-03 | Discharge: 2023-03-03 | Disposition: A | Discharge status: Home / Self Care | Payer: Medicaid Other | Attending: Family Medicine | Admitting: Family Medicine

## 2023-03-03 ENCOUNTER — Encounter: Payer: Self-pay | Admitting: Nurse Practitioner

## 2023-03-03 ENCOUNTER — Other Ambulatory Visit: Payer: Self-pay

## 2023-03-03 ENCOUNTER — Encounter (HOSPITAL_COMMUNITY): Payer: Self-pay | Admitting: *Deleted

## 2023-03-03 DIAGNOSIS — N611 Abscess of the breast and nipple: Secondary | ICD-10-CM | POA: Diagnosis not present

## 2023-03-03 DIAGNOSIS — N760 Acute vaginitis: Secondary | ICD-10-CM | POA: Diagnosis not present

## 2023-03-03 DIAGNOSIS — L732 Hidradenitis suppurativa: Secondary | ICD-10-CM | POA: Diagnosis present

## 2023-03-03 LAB — POCT URINALYSIS DIP (MANUAL ENTRY)
Bilirubin, UA: NEGATIVE
Blood, UA: NEGATIVE
Glucose, UA: NEGATIVE mg/dL
Leukocytes, UA: NEGATIVE
Nitrite, UA: NEGATIVE
Protein Ur, POC: NEGATIVE mg/dL
Spec Grav, UA: 1.025 (ref 1.010–1.025)
Urobilinogen, UA: 2 U/dL — AB
pH, UA: 7 (ref 5.0–8.0)

## 2023-03-03 LAB — POCT URINE PREGNANCY: Preg Test, Ur: NEGATIVE

## 2023-03-03 MED ORDER — CLINDAMYCIN HCL 300 MG PO CAPS: 300.0000 mg | ORAL_CAPSULE | Freq: Three times a day (TID) | ORAL | 0 refills | Status: AC

## 2023-03-03 MED ORDER — FLUCONAZOLE 150 MG PO TABS: 150.0000 mg | ORAL_TABLET | ORAL | 0 refills | Status: DC | PRN

## 2023-03-03 NOTE — Discharge Instructions (Addendum)
Vaginal cytology will result within 24 hours.  I will also notify you via MyChart of your urine results if any additional treatment is warranted.  I have gone ahead and prescribed you Diflucan take 1 tablet today and repeat in 3 days if needed for vaginitis symptoms related to yeast.  For abscess start clindamycin take 3 times daily for total of 10 days.  Return as needed.

## 2023-03-03 NOTE — ED Provider Notes (Signed)
[facility]    CSN: [id number] Arrival date & time: 03/03/23  1702      History   Chief Complaint Chief Complaint  Patient presents with   Skin Problem   Vaginitis    HPI Holly [name] is a 22 y.o. female.   HPI Patient is here with concern for skin infection related to a draining abscess under left breast.  Abscesses been draining for total of 2 days.  Patient has a history of hidradenitis suppurativa although has not had any abscesses within the last 90 days.  She reports that the first abscess she has gotten underneath her breast she typically gets them in the axilla area is the most common presentation.  Patient is also a type II diabetic and is currently being treated with [facility] and endorses recurrent yeast infections.  Patient however is uncertain if she may have a UTI. 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   'Light-for-dates' infant with signs of fetal malnutrition 12/30/2021   Uncontrolled type 2 diabetes mellitus with hyperglycemia (HCC) 06/04/2021   Insulin dose changed (HCC) 11/23/2018   Generalized anxiety disorder 08/09/2018   Type 2 diabetes mellitus treated with insulin (HCC) 01/12/2017   Hidradenitis suppurativa 12/12/2016   Anisocoria 12/12/2016   Undiagnosed cardiac murmurs 12/12/2016   Obesity, unspecified 12/04/2013   Mild acne 12/04/2013   Change in voice 11/01/2013   Panic attack 11/01/2013   Sinus tachycardia 11/01/2013    Past Surgical History:  Procedure Laterality Date   MOUTH SURGERY      OB History   No obstetric history on file.      Home Medications    Prior to Admission medications   Medication Sig Start Date End Date Taking? Authorizing Provider  Accu-Chek FastClix  Lancets MISC Use to check blood sugar 3 times a day 02/22/23  Yes Thapa, [place], MD  Ascorbic Acid (VITAMIN C ADULT GUMMIES PO) Take by mouth.   Yes [provider]  Blood Glucose Monitoring Suppl (ACCU-CHEK GUIDE) w/Device KIT Use to check sugars up to 3 times daily. 02/22/23  Yes Thapa, [place], MD  clindamycin (CLEOCIN) 300 MG capsule Take 1 capsule (300 mg total) by mouth 3 (three) times daily for 10 days. 03/03/23 03/13/23 Yes Bing Neighbors, NP  fluconazole (DIFLUCAN) 150 MG tablet Take 1 tablet (150 mg total) by mouth every three (3) days as needed. Repeat if needed 03/03/23  Yes Bing Neighbors, NP  glipiZIDE (GLUCOTROL XL) 5 MG 24 hr tablet Take 1 tablet (5 mg total) by mouth daily with breakfast. 02/23/23  Yes Thapa, [place], MD  glucose blood (ACCU-CHEK GUIDE) test strip Use as directed 3 times daily. 02/22/23  Yes Thapa, [place], MD  insulin degludec (TRESIBA FLEXTOUCH) 200 UNIT/ML FlexTouch Pen Inject 72 units daily 02/22/23  Yes Thapa, [place], MD  Insulin Pen Needle (BD PEN NEEDLE NANO 2ND GEN) 32G X 4 MM MISC USE TO INJECT INSULIN 6 TIMES DAILY 09/27/22  Yes Dessa Phi, MD  tirzepatide [facility]) 15 MG/0.5ML Pen Inject 15 mg into the skin once a week. 02/24/23  Yes Berniece Salines, FNP  Clindamycin-Benzoyl Per, Refr, gel Apply 1 application  topically 2 (two) times daily. 02/24/23   Berniece Salines, FNP  Continuous Glucose Receiver [facility]  G6 RECEIVER) DEVI 1 each by Does not apply route as directed. Patient not taking: Reported on 02/24/2023 02/17/23   Thapa, [place], MD  Continuous Glucose Sensor (DEXCOM G6 SENSOR) MISC USE 1 SENSOR EVERY 10 DAYS AS DIRECTED 02/24/23   Berniece Salines, FNP  Continuous Glucose Transmitter (DEXCOM G6 TRANSMITTER) MISC 1 EACH BUT DOES NOT APPLY ROUTE EVERY 3 MONTHS. Patient not taking: Reported on 02/24/2023 02/17/23   Thapa, [place], MD  Continuous Glucose Transmitter (DEXCOM G6 TRANSMITTER) MISC Use as directed, replace every 3 months 02/24/23   Berniece Salines,  FNP  Glucagon (BAQSIMI TWO PACK) 3 MG/DOSE POWD Place 1 each into the nose as needed (severe hypoglycmia with unresponsiveness). 08/08/18   Dessa Phi, MD    Family History Family History  Problem Relation Age of Onset   Diabetes Mother     Social History Social History   Tobacco Use   Smoking status: Never    Passive exposure: Never   Smokeless tobacco: Never  Vaping Use   Vaping status: Never Used  Substance Use Topics   Alcohol use: Never   Drug use: Never     Allergies   Patient has no known allergies.   Review of Systems Review of Systems Pertinent negatives listed in HPI   Physical Exam Triage Vital Signs ED Triage Vitals  Encounter Vitals Group     BP 03/03/23 1848 114/80     Systolic BP Percentile --      Diastolic BP Percentile --      Pulse Rate 03/03/23 1848 90     Resp 03/03/23 1848 16     Temp 03/03/23 1848 98.4 F (36.9 C)     Temp src --      SpO2 03/03/23 1848 98 %     Weight --      Height --      Head Circumference --      Peak Flow --      Pain Score 03/03/23 1843 0     Pain Loc --      Pain Education --      Exclude from Growth Chart --    No data found.  Updated Vital Signs BP 114/80   Pulse 90   Temp 98.4 F (36.9 C)   Resp 16   LMP 02/24/2023   SpO2 98%   Visual Acuity Right Eye Distance:   Left Eye Distance:   Bilateral Distance:    Right Eye Near:   Left Eye Near:    Bilateral Near:     Physical Exam Vitals reviewed.  HENT:     Head: Normocephalic and atraumatic.  Eyes:     Extraocular Movements: Extraocular movements intact.     Pupils: Pupils are equal, round, and reactive to light.  Cardiovascular:     Rate and Rhythm: Normal rate and regular rhythm.  Pulmonary:     Effort: Pulmonary effort is normal.     Breath sounds: Normal breath sounds.  Chest:    Musculoskeletal:     Cervical back: Normal range of motion.  Skin:    General: Skin is warm and dry.      UC Treatments / Results   Labs (all labs ordered are listed, but only abnormal results are displayed) Labs Reviewed  POCT URINALYSIS DIP (MANUAL ENTRY) - Abnormal; Notable for the following components:      Result Value   Ketones, POC UA trace (5) (*)    Urobilinogen, UA 2.0 (*)  All other components within normal limits  POCT URINE PREGNANCY  CERVICOVAGINAL ANCILLARY ONLY    EKG   Radiology No results found.  Procedures Procedures (including critical care time)  Medications Ordered in UC Medications - No data to display  Initial Impression / Assessment and Plan / UC Course  I have reviewed the triage vital signs and the nursing notes.  Pertinent labs & imaging results that were available during my care of the patient were reviewed by me and considered in my medical decision making (see chart for details).    Hidradenitis of provide left breast abscess treatment with clindamycin.  UA unremarkable.  For yeast infection of the Diflucan.  Collected a vaginal cytology swab including STD testing.  hCG pregnancy negative.  Return precautions given. Final Clinical Impressions(s) / UC Diagnoses   Final diagnoses:  Hidradenitis suppurativa  Left breast abscess  Vaginitis and vulvovaginitis     Discharge Instructions      Vaginal cytology will result within 24 hours.  I will also notify you via MyChart of your urine results if any additional treatment is warranted.  I have gone ahead and prescribed you Diflucan take 1 tablet today and repeat in 3 days if needed for vaginitis symptoms related to yeast.  For abscess start clindamycin take 3 times daily for total of 10 days.  Return as needed.   ED Prescriptions     Medication Sig Dispense Auth. Provider   clindamycin (CLEOCIN) 300 MG capsule Take 1 capsule (300 mg total) by mouth 3 (three) times daily for 10 days. 30 capsule Bing Neighbors, NP   fluconazole (DIFLUCAN) 150 MG tablet Take 1 tablet (150 mg total) by mouth every three (3) days as  needed. Repeat if needed 2 tablet Bing Neighbors, NP      PDMP not reviewed this encounter.   Bing Neighbors, NP 03/03/23 2021

## 2023-03-03 NOTE — ED Triage Notes (Signed)
Pt reports she had a abscess under Lt breast that has drained the patient is worried she may have cellulitis under Lt breast. Pt also may have a yeast infection. Pt has a vag DC.

## 2023-03-04 LAB — CERVICOVAGINAL ANCILLARY ONLY
Bacterial Vaginitis (gardnerella): NEGATIVE
Candida Glabrata: NEGATIVE
Candida Vaginitis: POSITIVE — AB
Chlamydia: NEGATIVE
Comment: NEGATIVE
Comment: NEGATIVE
Comment: NEGATIVE
Comment: NEGATIVE
Comment: NEGATIVE
Comment: NORMAL
Neisseria Gonorrhea: NEGATIVE
Trichomonas: POSITIVE — AB

## 2023-03-07 ENCOUNTER — Telehealth: Payer: Self-pay

## 2023-03-07 ENCOUNTER — Other Ambulatory Visit (HOSPITAL_COMMUNITY): Payer: Self-pay

## 2023-03-07 MED ORDER — METRONIDAZOLE 500 MG PO TABS: 500.0000 mg | ORAL_TABLET | Freq: Two times a day (BID) | ORAL | 0 refills | Status: AC

## 2023-03-08 ENCOUNTER — Other Ambulatory Visit (HOSPITAL_COMMUNITY): Payer: Self-pay

## 2023-03-18 ENCOUNTER — Other Ambulatory Visit (HOSPITAL_COMMUNITY): Payer: Self-pay

## 2023-03-22 NOTE — Progress Notes (Unsigned)
LMP 02/24/2023    Subjective:    Patient ID: Holly [name], female    DOB: January 16, 2001, 22 y.o.   MRN: [id number]  HPI: Holly [name] is a 22 y.o. female  No chief complaint on file.   Relevant past medical, surgical, family and social history reviewed and updated as indicated. Interim medical history since our last visit reviewed. Allergies and medications reviewed and updated.  Review of Systems  Ten systems reviewed and is negative except as mentioned in HPI ***      Objective:    LMP 02/24/2023   Wt Readings from Last 3 Encounters:  02/24/23 176 lb 9.6 oz (80.1 kg)  02/09/23 174 lb (78.9 kg)  01/07/23 172 lb (78 kg)    Physical Exam  Constitutional: Patient appears well-developed and well-nourished. Obese *** No distress.  HEENT: head atraumatic, normocephalic, pupils equal and reactive to light, ears ***, neck supple, throat within normal limits Cardiovascular: Normal rate, regular rhythm and normal heart sounds.  No murmur heard. No BLE edema. Pulmonary/Chest: Effort normal and breath sounds normal. No respiratory distress. Abdominal: Soft.  There is no tenderness. Psychiatric: Patient has a normal mood and affect. behavior is normal. Judgment and thought content normal.  Results for orders placed or performed during the hospital encounter of 03/03/23  POCT urine pregnancy  Result Value Ref Range   Preg Test, Ur Negative Negative  POC urinalysis dipstick  Result Value Ref Range   Color, UA yellow yellow   Clarity, UA clear clear   Glucose, UA negative negative mg/dL   Bilirubin, UA negative negative   Ketones, POC UA trace (5) (A) negative mg/dL   Spec Grav, UA 9.629 5.284 - 1.025   Blood, UA negative negative   pH, UA 7.0 5.0 - 8.0   Protein Ur, POC negative negative mg/dL   Urobilinogen, UA 2.0 (A) 0.2 or 1.0 E.U./dL   Nitrite, UA Negative Negative   Leukocytes, UA Negative Negative  Cervicovaginal ancillary only  Result Value  Ref Range   Neisseria Gonorrhea Negative    Chlamydia Negative    Trichomonas Positive (A)    Bacterial Vaginitis (gardnerella) Negative    Candida Vaginitis Positive (A)    Candida Glabrata Negative    Comment      Normal Reference Range Bacterial Vaginosis - Negative   Comment Normal Reference Range Candida Species - Negative    Comment Normal Reference Range Candida Galbrata - Negative    Comment Normal Reference Range Trichomonas - Negative    Comment Normal Reference Ranger Chlamydia - Negative    Comment      Normal Reference Range Neisseria Gonorrhea - Negative      Assessment & Plan:   Problem List Items Addressed This Visit   None    Follow up plan: No follow-ups on file.

## 2023-03-23 ENCOUNTER — Encounter: Payer: Self-pay | Admitting: Nurse Practitioner

## 2023-03-23 ENCOUNTER — Other Ambulatory Visit (HOSPITAL_COMMUNITY)
Admission: RE | Admit: 2023-03-23 | Discharge: 2023-03-23 | Disposition: A | Discharge status: Home / Self Care | Payer: Medicaid Other | Source: Ambulatory Visit | Attending: Nurse Practitioner | Admitting: Nurse Practitioner

## 2023-03-23 ENCOUNTER — Ambulatory Visit: Payer: Medicaid Other | Admitting: Nurse Practitioner

## 2023-03-23 VITALS — BP 112/72 | HR 90 | Temp 98.0°F | Resp 14 | Ht 62.0 in | Wt 174.8 lb

## 2023-03-23 DIAGNOSIS — N898 Other specified noninflammatory disorders of vagina: Secondary | ICD-10-CM | POA: Insufficient documentation

## 2023-03-24 LAB — CERVICOVAGINAL ANCILLARY ONLY
Bacterial Vaginitis (gardnerella): NEGATIVE
Candida Glabrata: NEGATIVE
Candida Vaginitis: NEGATIVE
Chlamydia: NEGATIVE
Comment: NEGATIVE
Comment: NEGATIVE
Comment: NEGATIVE
Comment: NEGATIVE
Comment: NEGATIVE
Comment: NORMAL
Neisseria Gonorrhea: NEGATIVE
Trichomonas: NEGATIVE

## 2023-04-25 ENCOUNTER — Other Ambulatory Visit (HOSPITAL_COMMUNITY): Payer: Self-pay

## 2023-04-27 ENCOUNTER — Other Ambulatory Visit (HOSPITAL_COMMUNITY): Payer: Self-pay

## 2023-05-17 ENCOUNTER — Encounter: Payer: Self-pay | Admitting: Endocrinology

## 2023-05-17 ENCOUNTER — Ambulatory Visit: Payer: Medicaid Other | Admitting: Endocrinology

## 2023-05-17 VITALS — BP 122/78 | HR 73 | Resp 20 | Ht 62.0 in | Wt 177.6 lb

## 2023-05-17 DIAGNOSIS — E1165 Type 2 diabetes mellitus with hyperglycemia: Secondary | ICD-10-CM | POA: Diagnosis not present

## 2023-05-17 LAB — POCT GLYCOSYLATED HEMOGLOBIN (HGB A1C): Hemoglobin A1C: 7 % — AB (ref 4.0–5.6)

## 2023-05-17 NOTE — Progress Notes (Signed)
Outpatient Endocrinology Note Holly Johanan Skorupski, MD  05/17/23  Patient's Name: Holly [name]    DOB: 01-01-2001    MRN: [id number]                                                    REASON OF VISIT: Follow-up for evaluation of type 2 diabetes mellitus  REFERRING PROVIDER: Dessa Phi, MD  PCP: Berniece Salines, FNP  HISTORY OF PRESENT ILLNESS:   Holly [name] is a 22 y.o. old female with past medical history listed below, is here for evaluation of type 2 diabetes mellitus.   Pertinent Diabetes History: Patient was diagnosed with type 2 diabetes mellitus, initially in June 2018, at the age of 16 years, regular blood work revealed hemoglobin A1c of 12%, at that time she was in [place] for vacation, PCP asked to go to ER due to elevated hemoglobin A1c, she went to [facility] and had her initial evaluation, she had negative GAD 65, IA-2 and insulin antibodies, records scanned into media and reviewed.  She was started on insulin therapy after the diagnosis with Lantus and NovoLog.  She was following with pediatric endocrinology, and established adult endocrinology care for the diabetes in August 2024.  History of DKA or diabetes related hospitalizations: none  Previous diabetes education: Yes   Family h/o diabetes mellitus: mother and aunt type 2 diabetes.   Chronic Diabetes Complications : Retinopathy: no. Last ophthalmology exam was done on annually.  Nephropathy: no Peripheral neuropathy: no Coronary artery disease: no Stroke: no  Relevant comorbidities and cardiovascular risk factors: Obesity: yes Body mass index is 32.48 kg/m.  Hypertension: no Hyperlipidemia. yes  Current / Home Diabetic regimen includes: Tresiba 72 units at bedtime.  Mounjaro 15 mg weekly.  Glipizide extended release 5 mg daily.  Prior diabetic medications: Metformin extended release , discontinued due to missing pills.  Denies side effects. Trulicity in the  past. [place] tried, high cost.   Glycemic data:    CONTINUOUS GLUCOSE MONITORING SYSTEM (CGMS) INTERPRETATION: At today's visit, we reviewed CGM downloads. The full report is scanned in the media. Reviewing the CGM trends, blood glucose are as follows:  Dexcom G7 CGM-  Sensor Download (Sensor download was reviewed and summarized below.) Dates: November 6 to May 17, 2023 for 14 days.  Glucose Management Indicator: 7.5% Sensor Average: 175 SD 51  Glycemic Trends:  <54: 0% 54-70: 0% 71-180: 57% 181-250: 35% 251-400: 8%  Interpretation: She has hyperglycemia with blood sugar up to 250s range at bedtime frequently related with bedtime snacks.  Occasional hyperglycemia with meals especially with supper.  Blood sugar trending down and in the normal range in early morning and until the early afternoon.  No hypoglycemia.  Hypoglycemia: Patient has no hypoglycemic episodes. Patient has hypoglycemia awareness.  Factors modifying glucose control: 1.  Diabetic diet assessment: 3 meals a day, sometimes eats late around bedtime, chips.  2.  Staying active or exercising: hike / walking / 3 x a week.   3.  Medication compliance: compliant all of the time.  Interval history  Diabetes regimen as reviewed above.  CGM data as reviewed above.  She has mostly evening and bedtime hyperglycemia.  She complains of occasional constipation which she reports is worsening after being on glipizide.  No other GI issues.  No other complaints today.  REVIEW OF SYSTEMS As per history of present illness.   PAST MEDICAL HISTORY: Past Medical History:  Diagnosis Date   Diabetes mellitus without complication (HCC)    Mild acne 12/04/2013   Obesity, unspecified 12/04/2013    PAST SURGICAL HISTORY: Past Surgical History:  Procedure Laterality Date   MOUTH SURGERY      ALLERGIES: No Known Allergies  FAMILY HISTORY:  Family History  Problem Relation Age of Onset   Diabetes Mother     SOCIAL  HISTORY: Social History   Socioeconomic History   Marital status: Single    Spouse name: Not on file   Number of children: Not on file   Years of education: Not on file   Highest education level: Not on file  Occupational History   Not on file  Tobacco Use   Smoking status: Never    Passive exposure: Never   Smokeless tobacco: Never  Vaping Use   Vaping status: Never Used  Substance and Sexual Activity   Alcohol use: Never   Drug use: Never   Sexual activity: Not Currently  Other Topics Concern   Not on file  Social History Narrative   No College. Not working right now   Social Determinants of Health   Financial Resource Strain: Low Risk  (01/07/2023)   Overall Financial Resource Strain (CARDIA)    Difficulty of Paying Living Expenses: Not hard at all  Food Insecurity: Low Risk  (10/28/2022)   [facility]   Hunger Vital Sign    Worried About Running Out of Food in the Last Year: Never true    Ran Out of Food in the Last Year: Never true  Transportation Needs: No Transportation Needs (10/28/2022)   [facility]   Transportation    In the past 12 months, has lack of reliable transportation kept you from medical appointments, meetings, work or from getting things needed for daily living? : No  Physical Activity: Sufficiently Active (01/07/2023)   Exercise Vital Sign    Days of Exercise per Week: 5 days    Minutes of Exercise per Session: 30 min  Stress: No Stress Concern Present (01/07/2023)   [employer] of Occupational Health - Occupational Stress Questionnaire    Feeling of Stress : Not at all  Social Connections: Moderately Integrated (01/07/2023)   Social Connection and Isolation Panel [NHANES]    Frequency of Communication with Friends and Family: More than three times a week    Frequency of Social Gatherings with Friends and Family: More than three times a week    Attends Religious Services: More than 4  times per year    Active Member of [employer] or Organizations: Yes    Attends Engineer, structural: More than 4 times per year    Marital Status: Never married    MEDICATIONS:  Current Outpatient Medications  Medication Sig Dispense Refill   Accu-Chek FastClix Lancets MISC Use to check blood sugar 3 times a day 300 each 5   Ascorbic Acid (VITAMIN C ADULT GUMMIES PO) Take by mouth.     Blood Glucose Monitoring Suppl (ACCU-CHEK GUIDE) w/Device KIT Use to check sugars up to 3 times daily. 1 kit 0   Clindamycin-Benzoyl Per, Refr, gel Apply 1 application  topically 2 (two) times daily. 45 g 1   Continuous Glucose Receiver (DEXCOM G6 RECEIVER) DEVI 1 each by Does not apply route as directed. 1 each 1   Continuous Glucose Sensor (DEXCOM  G6 SENSOR) MISC USE 1 SENSOR EVERY 10 DAYS AS DIRECTED 3 each 10   Continuous Glucose Transmitter (DEXCOM G6 TRANSMITTER) MISC 1 EACH BUT DOES NOT APPLY ROUTE EVERY 3 MONTHS. 1 each 3   Continuous Glucose Transmitter (DEXCOM G6 TRANSMITTER) MISC Use as directed, replace every 3 months 1 each 3   fluconazole (DIFLUCAN) 150 MG tablet Take 1 tablet (150 mg total) by mouth every three (3) days as needed. Repeat if needed 2 tablet 0   glipiZIDE (GLUCOTROL XL) 5 MG 24 hr tablet Take 1 tablet (5 mg total) by mouth daily with breakfast. 90 tablet 4   glucose blood (ACCU-CHEK GUIDE) test strip Use as directed 3 times daily. 300 each 5   insulin degludec (TRESIBA FLEXTOUCH) 200 UNIT/ML FlexTouch Pen Inject 72 units daily 36 mL 3   Insulin Pen Needle (BD PEN NEEDLE NANO 2ND GEN) 32G X 4 MM MISC USE TO INJECT INSULIN 6 TIMES DAILY 200 each 5   tirzepatide (MOUNJARO) 15 MG/0.5ML Pen Inject 15 mg into the skin once a week. 2 mL 5   No current facility-administered medications for this visit.    PHYSICAL EXAM: Vitals:   05/17/23 0836  BP: 122/78  Pulse: 73  Resp: 20  SpO2: 99%  Weight: 177 lb 9.6 oz (80.6 kg)  Height: 5\' 2"  (1.575 m)    Body mass index is 32.48  kg/m.  Wt Readings from Last 3 Encounters:  05/17/23 177 lb 9.6 oz (80.6 kg)  03/23/23 174 lb 12.8 oz (79.3 kg)  02/24/23 176 lb 9.6 oz (80.1 kg)    General: Well developed, well nourished female in no apparent distress.  HEENT: AT/[place], no external lesions.  Eyes: Conjunctiva clear and no icterus. Neck: Neck supple  Lungs: Respirations not labored Neurologic: Alert, oriented, normal speech Extremities / Skin: Dry. No sores or rashes noted.  Psychiatric: Does not appear depressed or anxious  Diabetic Foot Exam - Simple   No data filed     LABS Reviewed Lab Results  Component Value Date   HGBA1C 7.0 (A) 05/17/2023   HGBA1C 7.7 (A) 02/09/2023   HGBA1C 6.9 (A) 11/17/2022   No results found for: "FRUCTOSAMINE" Lab Results  Component Value Date   CHOL 145 11/17/2022   HDL 43 (L) 11/17/2022   LDLCALC 85 11/17/2022   TRIG 82 11/17/2022   CHOLHDL 3.4 11/17/2022   Lab Results  Component Value Date   MICRALBCREAT 4 11/17/2022   Lab Results  Component Value Date   CREATININE 0.48 02/24/2023   Lab Results  Component Value Date   GFR 135.52 02/24/2023    ASSESSMENT / PLAN  1. Uncontrolled type 2 diabetes mellitus with hyperglycemia (HCC)      Diabetes Mellitus type 2, complicated by no known complications - Diabetic status / severity: uncontrolled.   Lab Results  Component Value Date   HGBA1C 7.0 (A) 05/17/2023    - Hemoglobin A1c goal : <7%  Diabetes control is improving, hemoglobin A1c improved to 7%.  Congratulated her.  Discussed about diabetes and potential chronic complications and importance of controlling blood sugar.  Patient has mainly postprandial hyperglycemia at times, mainly at bedtime.  Discussed about portion control and limiting carbohydrate in the meal.  Asked to avoid bedtime snacks especially cheese.  Advised to take healthier snacks for example vegetables/may be nuts.  - Medications: No change.  I) continue Tresiba 72 units at  bedtime. II) continue Mounjaro 15 mg weekly. III) continue glipizide XR 5 mg daily,  take with supper.  - Home glucose testing: Continue Dexcom G7 CGM and check blood sugar as needed. - Discussed/ Gave Hypoglycemia treatment plan.  # Consult : not required at this time.   # Annual urine for microalbuminuria/ creatinine ratio, no microalbuminuria currently. Last  Lab Results  Component Value Date   MICRALBCREAT 4 11/17/2022    # Foot check nightly.  # Annual dilated diabetic eye exams.   - Diet: Make healthy diabetic food choices.  Discussed in detail mainly asked to avoid bedtime snacks especially chips. - Life style / activity / exercise: Discussed.  2. Blood pressure  -  BP Readings from Last 1 Encounters:  05/17/23 122/78    - Control is in target.  - No change in current plans.  3. Lipid status / Hyperlipidemia - Last  Lab Results  Component Value Date   LDLCALC 85 11/17/2022   -Statin is not indicated at this time.  Diagnoses and all orders for this visit:  Uncontrolled type 2 diabetes mellitus with hyperglycemia (HCC) -     POCT glycosylated hemoglobin (Hb A1C)    DISPOSITION Follow up in clinic in 3 months suggested.   All questions answered and patient verbalized understanding of the plan.  Holly Justyce Baby, MD [facility] Endocr[facility]ology [facility] [address] [place], [address] [place], [place] [address] Phone # [phone]  At least part of this note was generated using voice recognition software. Inadvertent word errors may have occurred, which were not recognized during the proofreading process.

## 2023-05-23 ENCOUNTER — Other Ambulatory Visit (HOSPITAL_COMMUNITY): Payer: Self-pay

## 2023-05-23 ENCOUNTER — Other Ambulatory Visit: Payer: Self-pay

## 2023-05-24 ENCOUNTER — Other Ambulatory Visit (HOSPITAL_COMMUNITY): Payer: Self-pay

## 2023-05-24 NOTE — Telephone Encounter (Signed)
Care team updated and letter sent for eye exam notes.

## 2023-06-01 ENCOUNTER — Other Ambulatory Visit (HOSPITAL_COMMUNITY): Payer: Self-pay

## 2023-06-01 ENCOUNTER — Other Ambulatory Visit: Payer: Self-pay

## 2023-06-13 ENCOUNTER — Other Ambulatory Visit: Payer: Self-pay

## 2023-06-13 ENCOUNTER — Encounter: Payer: Self-pay | Admitting: Endocrinology

## 2023-06-13 DIAGNOSIS — E119 Type 2 diabetes mellitus without complications: Secondary | ICD-10-CM

## 2023-06-13 MED ORDER — BD PEN NEEDLE NANO 2ND GEN 32G X 4 MM MISC
1.0000 | Freq: Every day | 5 refills | Status: DC
Start: 2023-06-13 — End: 2024-01-30
  Filled 2024-01-30: qty 200, 33d supply, fill #0

## 2023-07-13 ENCOUNTER — Encounter (INDEPENDENT_AMBULATORY_CARE_PROVIDER_SITE_OTHER): Payer: Self-pay

## 2023-07-21 ENCOUNTER — Other Ambulatory Visit (HOSPITAL_COMMUNITY)
Admission: RE | Admit: 2023-07-21 | Discharge: 2023-07-21 | Disposition: A | Discharge status: Home / Self Care | Payer: Medicaid Other | Source: Ambulatory Visit | Attending: Nurse Practitioner | Admitting: Nurse Practitioner

## 2023-07-21 ENCOUNTER — Encounter: Payer: Self-pay | Admitting: Nurse Practitioner

## 2023-07-21 ENCOUNTER — Ambulatory Visit: Payer: Medicaid Other | Admitting: Nurse Practitioner

## 2023-07-21 ENCOUNTER — Other Ambulatory Visit: Payer: Self-pay | Admitting: Nurse Practitioner

## 2023-07-21 VITALS — BP 116/78 | Temp 97.9°F | Resp 18 | Ht 62.0 in | Wt 181.1 lb

## 2023-07-21 DIAGNOSIS — Z794 Long term (current) use of insulin: Secondary | ICD-10-CM

## 2023-07-21 DIAGNOSIS — E119 Type 2 diabetes mellitus without complications: Secondary | ICD-10-CM | POA: Diagnosis not present

## 2023-07-21 DIAGNOSIS — Z113 Encounter for screening for infections with a predominantly sexual mode of transmission: Secondary | ICD-10-CM | POA: Insufficient documentation

## 2023-07-21 MED ORDER — GLIPIZIDE ER 5 MG PO TB24
5.0000 mg | ORAL_TABLET | Freq: Every day | ORAL | 0 refills | Status: DC
Start: 2023-07-21 — End: 2023-08-15

## 2023-07-21 NOTE — Progress Notes (Signed)
BP 116/78   Temp 97.9 F (36.6 C)   Resp 18   Ht 5\' 2"  (1.575 m)   Wt 181 lb 1.6 oz (82.1 kg)   LMP 07/12/2023   BMI 33.12 kg/m    Subjective:    Patient ID: Holly [name], female    DOB: 02-11-2001, 23 y.o.   MRN: [id number]  HPI: Holly [name] is a 23 y.o. female  Chief Complaint  Patient presents with   std screen   Medication Refill    Early fill on glipizide lost bottle?    Discussed the use of AI scribe software for clinical note transcription with the patient, who gave verbal consent to proceed.  History of Present Illness   The patient, a type 2 diabetic, last saw endocrinology on May 17, 2023. Her last A1c was 7.0, indicating relatively good control of her diabetes. She is currently on Tresiba seventy two units at bedtime, Mounjaro fifteen mgs weekly, and glipizide five mgs daily. The patient requested a refill of her glipizide during this visit, indicating adherence to her medication regimen.  In addition to her diabetes management, the patient requested STD testing. She opted for a vaginal swab test for gonorrhea and chlamydia, declining the blood test for HIV, hepatitis C, and syphilis. The patient reported no symptoms suggestive of an STD and confirmed that she is practicing safe sex.  The patient's last menstrual cycle was on January 14th, which is relevant to the timing of the STD testing.       03/23/2023    9:03 AM 02/24/2023    1:44 PM 01/07/2023    8:54 AM  Depression screen PHQ 2/9  Decreased Interest 0 0 0  Down, Depressed, Hopeless 0 0 0  PHQ - 2 Score 0 0 0  Altered sleeping 0  1  Tired, decreased energy 0  0  Change in appetite 0  0  Feeling bad or failure about yourself  0  0  Trouble concentrating 0  0  Moving slowly or fidgety/restless 0  0  Suicidal thoughts 0  0  PHQ-9 Score 0  1  Difficult doing work/chores   Not difficult at all    Relevant past medical, surgical, family and social history reviewed and  updated as indicated. Interim medical history since our last visit reviewed. Allergies and medications reviewed and updated.  Review of Systems  Constitutional: Negative for fever or weight change.  Respiratory: Negative for cough and shortness of breath.   Cardiovascular: Negative for chest pain or palpitations.  Gastrointestinal: Negative for abdominal pain, no bowel changes.  Musculoskeletal: Negative for gait problem or joint swelling.  Skin: Negative for rash.  Neurological: Negative for dizziness or headache.  No other specific complaints in a complete review of systems (except as listed in HPI above).      Objective:    BP 116/78   Temp 97.9 F (36.6 C)   Resp 18   Ht 5\' 2"  (1.575 m)   Wt 181 lb 1.6 oz (82.1 kg)   LMP 07/12/2023   BMI 33.12 kg/m    Wt Readings from Last 3 Encounters:  07/21/23 181 lb 1.6 oz (82.1 kg)  05/17/23 177 lb 9.6 oz (80.6 kg)  03/23/23 174 lb 12.8 oz (79.3 kg)    Physical Exam Vitals reviewed.  Constitutional:      Appearance: Normal appearance.  HENT:     Head: Normocephalic.  Cardiovascular:     Rate and Rhythm: Normal rate and regular rhythm.  Pulmonary:     Effort: Pulmonary effort is normal.     Breath sounds: Normal breath sounds.  Musculoskeletal:        General: Normal range of motion.  Skin:    General: Skin is warm and dry.  Neurological:     General: No focal deficit present.     Mental Status: She is alert and oriented to person, place, and time. Mental status is at baseline.  Psychiatric:        Mood and Affect: Mood normal.        Behavior: Behavior normal.        Thought Content: Thought content normal.        Judgment: Judgment normal.     Results for orders placed or performed in visit on 05/17/23  POCT glycosylated hemoglobin (Hb A1C)   Collection Time: 05/17/23  8:37 AM  Result Value Ref Range   Hemoglobin A1C 7.0 (A) 4.0 - 5.6 %   HbA1c POC (<> result, manual entry)     HbA1c, POC (prediabetic range)      HbA1c, POC (controlled diabetic range)         Assessment & Plan:   Problem List Items Addressed This Visit       Endocrine   Type 2 diabetes mellitus treated with insulin (HCC)   Relevant Medications   glipiZIDE (GLUCOTROL XL) 5 MG 24 hr tablet   Other Visit Diagnoses       Screening examination for STD (sexually transmitted disease)    -  Primary   Relevant Orders   Cervicovaginal ancillary only        Assessment and Plan    Type 2 Diabetes Mellitus Last A1c was 7.0 on May 17, 2023. Currently on Tresiba 72 units at bedtime, Mounjaro 15 mg weekly, and Glipizide 5 mg daily. -Refill Glipizide 5 mg daily prescription at [facility] in [place]. -she lost her bottle, she is aware that she will have to pay cash for prescription  STD Screening No symptoms reported. Patient requested vaginal swab only. -Perform vaginal swab for gonorrhea and chlamydia. -Notify patient of results on Monday, July 25, 2023.        Follow up plan: Return if symptoms worsen or fail to improve.

## 2023-07-22 NOTE — Telephone Encounter (Signed)
Duplicate request. Last refill 07/21/23. E-Prescribing Status: Receipt confirmed by pharmacy (07/21/2023  3:37 PM EST)   Requested Prescriptions  Pending Prescriptions Disp Refills   glipiZIDE (GLUCOTROL XL) 5 MG 24 hr tablet [Pharmacy Med Name: GLIPIZIDE ER 5MG  TABLETS] 90 tablet     Sig: TAKE 1 TABLET(5 MG) BY MOUTH DAILY WITH BREAKFAST     Endocrinology:  Diabetes - Sulfonylureas Passed - 07/22/2023  9:03 AM      Passed - HBA1C is between 0 and 7.9 and within 180 days    Hemoglobin A1C  Date Value Ref Range Status  05/17/2023 7.0 (A) 4.0 - 5.6 % Final   HbA1c, POC (prediabetic range)  Date Value Ref Range Status  11/21/2018 0 (A) 5.7 - 6.4 % Final   HbA1c, POC (controlled diabetic range)  Date Value Ref Range Status  11/21/2018 0.0 0.0 - 7.0 % Final   HbA1c POC (<> result, manual entry)  Date Value Ref Range Status  11/21/2018 0 4.0 - 5.6 % Final   Hgb A1c MFr Bld  Date Value Ref Range Status  09/29/2021 8.4 (H) <5.7 % of total Hgb Final    Comment:    For someone without known diabetes, a hemoglobin A1c value of 6.5% or greater indicates that they may have  diabetes and this should be confirmed with a follow-up  test. . For someone with known diabetes, a value <7% indicates  that their diabetes is well controlled and a value  greater than or equal to 7% indicates suboptimal  control. A1c targets should be individualized based on  duration of diabetes, age, comorbid conditions, and  other considerations. . Currently, no consensus exists regarding use of hemoglobin A1c for diagnosis of diabetes for children. .          Passed - Cr in normal range and within 360 days    Creat  Date Value Ref Range Status  11/17/2022 0.39 (L) 0.50 - 0.96 mg/dL Final   Creatinine, Ser  Date Value Ref Range Status  02/24/2023 0.48 0.40 - 1.20 mg/dL Final   Creatinine, Urine  Date Value Ref Range Status  11/17/2022 227 20 - 275 mg/dL Final         Passed - Valid encounter  within last 6 months    Recent Outpatient Visits           Yesterday Screening examination for STD (sexually transmitted disease)   [facility] Berniece Salines, FNP   4 months ago Vaginal discharge   [facility] Della Goo F, FNP   4 months ago Type 2 diabetes mellitus treated with insulin [facility])   [facility] Berniece Salines, FNP   6 months ago Annual physical exam   [facility] Berniece Salines, FNP       Future Appointments             In 6 months Zane Herald, Rudolpho Sevin, FNP [facility], [facility]

## 2023-07-24 LAB — CERVICOVAGINAL ANCILLARY ONLY
Bacterial Vaginitis (gardnerella): NEGATIVE
Candida Glabrata: NEGATIVE
Candida Vaginitis: NEGATIVE
Chlamydia: NEGATIVE
Comment: NEGATIVE
Comment: NEGATIVE
Comment: NEGATIVE
Comment: NEGATIVE
Comment: NEGATIVE
Comment: NORMAL
Neisseria Gonorrhea: NEGATIVE
Trichomonas: NEGATIVE

## 2023-07-25 ENCOUNTER — Encounter: Payer: Self-pay | Admitting: Nurse Practitioner

## 2023-08-15 ENCOUNTER — Ambulatory Visit: Payer: Medicaid Other | Admitting: Endocrinology

## 2023-08-15 ENCOUNTER — Other Ambulatory Visit: Payer: Self-pay | Admitting: Nurse Practitioner

## 2023-08-15 ENCOUNTER — Other Ambulatory Visit (HOSPITAL_COMMUNITY): Payer: Self-pay

## 2023-08-15 ENCOUNTER — Encounter: Payer: Self-pay | Admitting: Endocrinology

## 2023-08-15 ENCOUNTER — Other Ambulatory Visit: Payer: Self-pay

## 2023-08-15 VITALS — BP 118/60 | HR 88 | Resp 20 | Ht 62.0 in | Wt 179.0 lb

## 2023-08-15 DIAGNOSIS — Z794 Long term (current) use of insulin: Secondary | ICD-10-CM

## 2023-08-15 DIAGNOSIS — Z7984 Long term (current) use of oral hypoglycemic drugs: Secondary | ICD-10-CM

## 2023-08-15 DIAGNOSIS — Z7985 Long-term (current) use of injectable non-insulin antidiabetic drugs: Secondary | ICD-10-CM

## 2023-08-15 DIAGNOSIS — E1165 Type 2 diabetes mellitus with hyperglycemia: Secondary | ICD-10-CM

## 2023-08-15 LAB — POCT GLYCOSYLATED HEMOGLOBIN (HGB A1C): Hemoglobin A1C: 7.2 % — AB (ref 4.0–5.6)

## 2023-08-15 MED ORDER — EMPAGLIFLOZIN 25 MG PO TABS
25.0000 mg | ORAL_TABLET | Freq: Every day | ORAL | 4 refills | Status: AC
Start: 2023-08-15 — End: ?
  Filled 2023-08-15: qty 30, 30d supply, fill #0
  Filled 2023-09-10 – 2023-09-15 (×4): qty 30, 30d supply, fill #1
  Filled 2023-10-11: qty 30, 30d supply, fill #2
  Filled 2023-11-11: qty 30, 30d supply, fill #3
  Filled 2023-12-11: qty 30, 30d supply, fill #4
  Filled 2024-01-10: qty 30, 30d supply, fill #5
  Filled 2024-02-07: qty 30, 30d supply, fill #6
  Filled 2024-03-06: qty 30, 30d supply, fill #7
  Filled 2024-04-04: qty 30, 30d supply, fill #8
  Filled 2024-05-05: qty 30, 30d supply, fill #9
  Filled 2024-06-04: qty 30, 30d supply, fill #10
  Filled 2024-07-05: qty 30, 30d supply, fill #11

## 2023-08-15 MED ORDER — GLIPIZIDE ER 5 MG PO TB24
5.0000 mg | ORAL_TABLET | Freq: Every day | ORAL | 3 refills | Status: DC
  Filled 2023-08-15: qty 90, 90d supply, fill #0

## 2023-08-15 NOTE — Progress Notes (Signed)
Outpatient Endocrinology Note [name], MD  08/15/23  Patient's Name: Holly [name]    DOB: Dec 28, 2000    MRN: [id number]                                                    REASON OF VISIT: Follow-up for follow up of type 2 diabetes mellitus  REFERRING PROVIDER: Dessa Phi, MD  PCP: Berniece Salines, FNP  HISTORY OF PRESENT ILLNESS:   Holly [name] is a 23 y.o. old female with past medical history listed below, is here for follow up of type 2 diabetes mellitus.   Pertinent Diabetes History: Patient was diagnosed with type 2 diabetes mellitus, initially in June 2018, at the age of 16 years, regular blood work revealed hemoglobin A1c of 12%, at that time she was in [place] for vacation, PCP asked to go to ER due to elevated hemoglobin A1c, she went to [facility] and had her initial evaluation, she had negative GAD 65, IA-2 and insulin antibodies, records scanned into media and reviewed.  She was started on insulin therapy after the diagnosis with Lantus and NovoLog.  She was following with pediatric endocrinology, and established adult endocrinology care for the diabetes in August 2024.  History of DKA or diabetes related hospitalizations: none  Previous diabetes education: Yes   Family h/o diabetes mellitus: mother and aunt type 2 diabetes.   Chronic Diabetes Complications : Retinopathy: no. Last ophthalmology exam was done on annually.  Nephropathy: no Peripheral neuropathy: no Coronary artery disease: no Stroke: no  Relevant comorbidities and cardiovascular risk factors: Obesity: yes Body mass index is 32.74 kg/m.  Hypertension: no Hyperlipidemia. yes  Current / Home Diabetic regimen includes: Tresiba 72 units at bedtime.  Mounjaro 15 mg weekly.  Glipizide extended release 5 mg daily with supper.  Prior diabetic medications: Metformin extended release , discontinued due to missing pills.  Denies side  effects. Trulicity in the past. [place] tried, high cost.   Glycemic data:    CONTINUOUS GLUCOSE MONITORING SYSTEM (CGMS) INTERPRETATION: At today's visit, we reviewed CGM downloads. The full report is scanned in the media. Reviewing the CGM trends, blood glucose are as follows:  Dexcom G7 CGM-  Sensor Download (Sensor download was reviewed and summarized below.) Dates: February 4 to August 15, 2023 for 14 days.  Glucose Management Indicator: 7.4% Sensor Average: 170 SD 51    Interpretation: Patient has hyperglycemia frequently with meals with a blood sugar up to 250 range, hyperglycemia mostly with supper and some time with breakfast and lunch.  Some of the days acceptable blood sugar.  Blood sugar early morning and overnight mostly acceptable.  No hypoglycemia.  Blood sugar in between the meals are acceptable range.  Hypoglycemia: Patient has no hypoglycemic episodes. Patient has hypoglycemia awareness.  Factors modifying glucose control: 1.  Diabetic diet assessment: 3 meals a day, sometimes eats late around bedtime, chips.  2.  Staying active or exercising: hike / walking / 3 x a week.   3.  Medication compliance: compliant all of the time.  Interval history  CGM data as reviewed above.  Diabetes regimen as reviewed and noted above.  Patient has current postprandial hyperglycemia.  She has no other complaints today.  Hemoglobin A1c 7.2%.     REVIEW OF SYSTEMS As per history of present illness.  PAST MEDICAL HISTORY: Past Medical History:  Diagnosis Date   Diabetes mellitus without complication (HCC)    Mild acne 12/04/2013   Obesity, unspecified 12/04/2013    PAST SURGICAL HISTORY: Past Surgical History:  Procedure Laterality Date   MOUTH SURGERY      ALLERGIES: No Known Allergies  FAMILY HISTORY:  Family History  Problem Relation Age of Onset   Diabetes Mother     SOCIAL HISTORY: Social History   Socioeconomic History   Marital status: Single     Spouse name: Not on file   Number of children: Not on file   Years of education: Not on file   Highest education level: Not on file  Occupational History   Not on file  Tobacco Use   Smoking status: Never    Passive exposure: Never   Smokeless tobacco: Never  Vaping Use   Vaping status: Never Used  Substance and Sexual Activity   Alcohol use: Never   Drug use: Never   Sexual activity: Not Currently  Other Topics Concern   Not on file  Social History Narrative   No College. Not working right now   Social Drivers of Corporate investment banker Strain: Low Risk  (01/07/2023)   Overall Financial Resource Strain (CARDIA)    Difficulty of Paying Living Expenses: Not hard at all  Food Insecurity: Low Risk  (10/28/2022)   [facility]   Hunger Vital Sign    Worried About Running Out of Food in the Last Year: Never true    Ran Out of Food in the Last Year: Never true  Transportation Needs: No Transportation Needs (10/28/2022)   [facility]   Transportation    In the past 12 months, has lack of reliable transportation kept you from medical appointments, meetings, work or from getting things needed for daily living? : No  Physical Activity: Sufficiently Active (01/07/2023)   Exercise Vital Sign    Days of Exercise per Week: 5 days    Minutes of Exercise per Session: 30 min  Stress: No Stress Concern Present (01/07/2023)   [employer] of Occupational Health - Occupational Stress Questionnaire    Feeling of Stress : Not at all  Social Connections: Moderately Integrated (01/07/2023)   Social Connection and Isolation Panel [NHANES]    Frequency of Communication with Friends and Family: More than three times a week    Frequency of Social Gatherings with Friends and Family: More than three times a week    Attends Religious Services: More than 4 times per year    Active Member of [employer] or Organizations: Yes    Attends Museum/gallery exhibitions officer: More than 4 times per year    Marital Status: Never married    MEDICATIONS:  Current Outpatient Medications  Medication Sig Dispense Refill   Accu-Chek FastClix Lancets MISC Use to check blood sugar 3 times a day 300 each 5   Blood Glucose Monitoring Suppl (ACCU-CHEK GUIDE) w/Device KIT Use to check sugars up to 3 times daily. 1 kit 0   Clindamycin-Benzoyl Per, Refr, gel Apply 1 application  topically 2 (two) times daily. 45 g 1   Continuous Glucose Sensor (DEXCOM G6 SENSOR) MISC USE 1 SENSOR EVERY 10 DAYS AS DIRECTED 3 each 10   Continuous Glucose Transmitter (DEXCOM G6 TRANSMITTER) MISC 1 EACH BUT DOES NOT APPLY ROUTE EVERY 3 MONTHS. 1 each 3   Continuous Glucose Transmitter (DEXCOM G6 TRANSMITTER) MISC Use  as directed, replace every 3 months 1 each 3   empagliflozin (JARDIANCE) 25 MG TABS tablet Take 1 tablet (25 mg total) by mouth daily before breakfast. 90 tablet 4   fluconazole (DIFLUCAN) 150 MG tablet Take 1 tablet (150 mg total) by mouth every three (3) days as needed. Repeat if needed 2 tablet 0   glucose blood (ACCU-CHEK GUIDE) test strip Use as directed 3 times daily. 300 each 5   insulin degludec (TRESIBA FLEXTOUCH) 200 UNIT/ML FlexTouch Pen Inject 72 units daily 36 mL 3   Insulin Pen Needle (BD PEN NEEDLE NANO 2ND GEN) 32G X 4 MM MISC USE TO INJECT INSULIN 6 TIMES DAILY 200 each 5   tirzepatide (MOUNJARO) 15 MG/0.5ML Pen Inject 15 mg into the skin once a week. 2 mL 5   Ascorbic Acid (VITAMIN C ADULT GUMMIES PO) Take by mouth.     Continuous Glucose Receiver (DEXCOM G6 RECEIVER) DEVI 1 each by Does not apply route as directed. 1 each 1   glipiZIDE (GLUCOTROL XL) 5 MG 24 hr tablet Take 1 tablet (5 mg total) by mouth daily with breakfast. 90 tablet 3   No current facility-administered medications for this visit.    PHYSICAL EXAM: Vitals:   08/15/23 0830  BP: 118/60  Pulse: 88  Resp: 20  SpO2: 97%  Weight: 179 lb (81.2 kg)  Height: 5\' 2"  (1.575 m)     Body mass index is 32.74 kg/m.  Wt Readings from Last 3 Encounters:  08/15/23 179 lb (81.2 kg)  07/21/23 181 lb 1.6 oz (82.1 kg)  05/17/23 177 lb 9.6 oz (80.6 kg)    General: Well developed, well nourished female in no apparent distress.  HEENT: AT/[place], no external lesions.  Eyes: Conjunctiva clear and no icterus. Neck: Neck supple  Lungs: Respirations not labored Neurologic: Alert, oriented, normal speech Extremities / Skin: Dry. .  Psychiatric: Does not appear depressed or anxious  Diabetic Foot Exam - Simple   No data filed     LABS Reviewed Lab Results  Component Value Date   HGBA1C 7.2 (A) 08/15/2023   HGBA1C 7.0 (A) 05/17/2023   HGBA1C 7.7 (A) 02/09/2023   No results found for: "FRUCTOSAMINE" Lab Results  Component Value Date   CHOL 145 11/17/2022   HDL 43 (L) 11/17/2022   LDLCALC 85 11/17/2022   TRIG 82 11/17/2022   CHOLHDL 3.4 11/17/2022   Lab Results  Component Value Date   MICRALBCREAT 4 11/17/2022   Lab Results  Component Value Date   CREATININE 0.48 02/24/2023   Lab Results  Component Value Date   GFR 135.52 02/24/2023    ASSESSMENT / PLAN  1. Uncontrolled type 2 diabetes mellitus with hyperglycemia (HCC)   2. Type 2 diabetes mellitus treated with insulin (HCC)       Diabetes Mellitus type 2, complicated by no known complications - Diabetic status / severity: uncontrolled.   Lab Results  Component Value Date   HGBA1C 7.2 (A) 08/15/2023    - Hemoglobin A1c goal : <7%  Patient has mainly postprandial hyperglycemia at times, mainly at bedtime.  Rediscussed about portion control and limiting carbohydrate in the meal.  Asked to avoid bedtime snacks especially cheese.  Advised to take healthier snacks for example vegetables/may be nuts.  - Medications: No change.  I) continue Tresiba 72 units at bedtime. II) continue Mounjaro 15 mg weekly. III) continue glipizide XR 5 mg daily, take with supper.  - Home glucose testing: Continue  Dexcom G7 CGM  and check blood sugar as needed. - Discussed/ Gave Hypoglycemia treatment plan.  # Consult : not required at this time.   # Annual urine for microalbuminuria/ creatinine ratio, no microalbuminuria currently. Last  Lab Results  Component Value Date   MICRALBCREAT 4 11/17/2022    # Foot check nightly.  # Annual dilated diabetic eye exams.   - Diet: Make healthy diabetic food choices.  Discussed in detail mainly asked to avoid bedtime snacks especially chips. - Life style / activity / exercise: Discussed.  2. Blood pressure  -  BP Readings from Last 1 Encounters:  08/15/23 118/60    - Control is in target.  - No change in current plans.  3. Lipid status / Hyperlipidemia - Last  Lab Results  Component Value Date   LDLCALC 85 11/17/2022   -Statin is not indicated at this time.  Diagnoses and all orders for this visit:  Uncontrolled type 2 diabetes mellitus with hyperglycemia (HCC) -     POCT glycosylated hemoglobin (Hb A1C) -     empagliflozin (JARDIANCE) 25 MG TABS tablet; Take 1 tablet (25 mg total) by mouth daily before breakfast.  Type 2 diabetes mellitus treated with insulin (HCC) -     glipiZIDE (GLUCOTROL XL) 5 MG 24 hr tablet; Take 1 tablet (5 mg total) by mouth daily with breakfast.   DISPOSITION Follow up in clinic in 3 months suggested.   All questions answered and patient verbalized understanding of the plan.  [place] Jeiden Daughtridge, MD [facility] Endocrinology [facility] [address] [place], [address] [place], [place] [address] Phone # [phone]  At least part of this note was generated using voice recognition software. Inadvertent word errors may have occurred, which were not recognized during the proofreading process.

## 2023-08-15 NOTE — Telephone Encounter (Signed)
Requested medication (s) are due for refill today: Yes  Requested medication (s) are on the active medication list: Yes  Last refill:  02/24/23  Future visit scheduled: Yes  Notes to clinic:  Unable to refill due to no refill protocol for this medication.      Requested Prescriptions  Pending Prescriptions Disp Refills   tirzepatide (MOUNJARO) 15 MG/0.5ML Pen 2 mL 5    Sig: Inject 15 mg into the skin once a week.     Off-Protocol Failed - 08/15/2023  4:03 PM      Failed - Medication not assigned to a protocol, review manually.      Passed - Valid encounter within last 12 months    Recent Outpatient Visits           3 weeks ago Screening examination for STD (sexually transmitted disease)   [facility] Berniece Salines, FNP   4 months ago Vaginal discharge   [facility] Della Goo F, FNP   5 months ago Type 2 diabetes mellitus treated with insulin [facility])   [facility] Berniece Salines, FNP   7 months ago Annual physical exam   [facility] Berniece Salines, FNP       Future Appointments             In 5 months Zane Herald, Rudolpho Sevin, FNP [facility], [facility]

## 2023-08-16 ENCOUNTER — Other Ambulatory Visit (HOSPITAL_COMMUNITY): Payer: Self-pay

## 2023-08-16 MED ORDER — MOUNJARO 15 MG/0.5ML ~~LOC~~ SOAJ
15.0000 mg | SUBCUTANEOUS | 5 refills | Status: DC
  Filled 2023-08-16: qty 2, 28d supply, fill #0
  Filled 2023-08-31 – 2023-09-06 (×3): qty 2, 28d supply, fill #1
  Filled 2023-10-07: qty 2, 28d supply, fill #2
  Filled 2023-11-03: qty 2, 28d supply, fill #3
  Filled 2023-12-04: qty 2, 28d supply, fill #4
  Filled 2024-01-01: qty 2, 28d supply, fill #5

## 2023-08-25 ENCOUNTER — Other Ambulatory Visit (HOSPITAL_COMMUNITY): Payer: Self-pay

## 2023-08-31 ENCOUNTER — Other Ambulatory Visit (HOSPITAL_COMMUNITY): Payer: Self-pay

## 2023-08-31 ENCOUNTER — Other Ambulatory Visit: Payer: Self-pay

## 2023-09-08 ENCOUNTER — Other Ambulatory Visit (HOSPITAL_COMMUNITY): Payer: Self-pay

## 2023-09-12 ENCOUNTER — Other Ambulatory Visit (HOSPITAL_COMMUNITY): Payer: Self-pay

## 2023-09-12 ENCOUNTER — Telehealth: Payer: Self-pay | Admitting: Pharmacy Technician

## 2023-09-12 ENCOUNTER — Encounter: Payer: Self-pay | Admitting: Endocrinology

## 2023-09-12 NOTE — Telephone Encounter (Signed)
 Yes, decrease the dose of Tresiba from 72 to 64 units daily.

## 2023-09-12 NOTE — Telephone Encounter (Signed)
 Pharmacy Patient Advocate Encounter  Received notification from [facility] that Prior Authorization for Jardiance 25MG  tablets has been APPROVED from 09/12/2023 to 09/11/2024. Ran test claim, Copay is $4.00. This test claim was processed through [facility]- copay amounts may vary at other pharmacies due to pharmacy/plan contracts, or as the patient moves through the different stages of their insurance plan.   PA #/Case ID/Reference #: [id number] Key: [id number]

## 2023-09-14 ENCOUNTER — Other Ambulatory Visit (HOSPITAL_COMMUNITY): Payer: Self-pay

## 2023-09-14 NOTE — Telephone Encounter (Signed)
 Approved see telephone encounter 09/12/23

## 2023-09-15 ENCOUNTER — Other Ambulatory Visit (HOSPITAL_COMMUNITY): Payer: Self-pay

## 2023-09-16 ENCOUNTER — Other Ambulatory Visit (HOSPITAL_COMMUNITY): Payer: Self-pay

## 2023-09-27 ENCOUNTER — Other Ambulatory Visit (HOSPITAL_COMMUNITY): Payer: Self-pay

## 2023-09-27 ENCOUNTER — Telehealth: Payer: Self-pay

## 2023-09-27 NOTE — Telephone Encounter (Signed)
 Prior auth on    Continuous Glucose Sensor (DEXCOM G6 SENSOR) MISC    Key [id number]

## 2023-09-28 NOTE — Telephone Encounter (Signed)
 PA started yesterday but it has been a while since seeing her

## 2023-09-29 ENCOUNTER — Other Ambulatory Visit (HOSPITAL_COMMUNITY): Payer: Self-pay

## 2023-10-03 ENCOUNTER — Other Ambulatory Visit (HOSPITAL_COMMUNITY): Payer: Self-pay

## 2023-10-03 NOTE — Progress Notes (Signed)
 "  BP 112/70   Resp 18   Ht 5' 2 (1.575 m)   Wt 173 lb 9.6 oz (78.7 kg)   LMP 10/01/2023   SpO2 98%   BMI 31.75 kg/m    Subjective:    Patient ID: Holly [name], female    DOB: 2001-01-29, 23 y.o.   MRN: [id number]  HPI: Holly [name] is a 23 y.o. female  Chief Complaint  Patient presents with   std screen    Discussed the use of AI scribe software for clinical note transcription with the patient, who gave verbal consent to proceed.  History of Present Illness Holly [name] is a 23 year old female who presents for a vaginal swab for sexually transmitted infections.  She is undergoing screening for sexually transmitted infections, specifically targeting gonorrhea, chlamydia, and trichomonas. She has opted not to undergo blood tests for HIV, hepatitis C, and syphilis at this time.  She currently has no symptoms and has completed the vaginal swab. Her last menstrual cycle was 10/01/2023.  Results are expected in approximately two days. Recommend practicing safe sex.         10/04/2023    8:49 AM 03/23/2023    9:03 AM 02/24/2023    1:44 PM  Depression screen PHQ 2/9  Decreased Interest 0 0 0  Down, Depressed, Hopeless 0 0 0  PHQ - 2 Score 0 0 0  Altered sleeping 0 0   Tired, decreased energy 0 0   Change in appetite 0 0   Feeling bad or failure about yourself  0 0   Trouble concentrating 0 0   Moving slowly or fidgety/restless 0 0   Suicidal thoughts 0 0   PHQ-9 Score 0 0   Difficult doing work/chores Not difficult at all      Relevant past medical, surgical, family and social history reviewed and updated as indicated. Interim medical history since our last visit reviewed. Allergies and medications reviewed and updated.  Review of Systems  Constitutional: Negative for fever or weight change.  Respiratory: Negative for cough and shortness of breath.   Cardiovascular: Negative for chest pain or palpitations.  Gastrointestinal:  Negative for abdominal pain, no bowel changes.  Musculoskeletal: Negative for gait problem or joint swelling.  Skin: Negative for rash.  Neurological: Negative for dizziness or headache.  No other specific complaints in a complete review of systems (except as listed in HPI above).      Objective:    BP 112/70   Resp 18   Ht 5' 2 (1.575 m)   Wt 173 lb 9.6 oz (78.7 kg)   LMP 10/01/2023   SpO2 98%   BMI 31.75 kg/m    Wt Readings from Last 3 Encounters:  10/04/23 173 lb 9.6 oz (78.7 kg)  08/15/23 179 lb (81.2 kg)  07/21/23 181 lb 1.6 oz (82.1 kg)    Physical Exam Vitals reviewed.  Constitutional:      Appearance: Normal appearance.  HENT:     Head: Normocephalic.  Cardiovascular:     Rate and Rhythm: Normal rate.  Pulmonary:     Effort: Pulmonary effort is normal.  Neurological:     General: No focal deficit present.     Mental Status: She is alert and oriented to person, place, and time. Mental status is at baseline.  Psychiatric:        Mood and Affect: Mood normal.        Behavior: Behavior normal.  Thought Content: Thought content normal.        Judgment: Judgment normal.          Assessment & Plan:   Problem List Items Addressed This Visit   None Visit Diagnoses       Screening examination for STD (sexually transmitted disease)    -  Primary   Relevant Orders   Cervicovaginal ancillary only        Assessment and Plan Assessment & Plan Sexually Transmitted Infection Screening She requested a vaginal swab for STI screening, specifically for gonorrhea, chlamydia, and trichomonas. She is asymptomatic and declined blood work for HIV, hepatitis C, and syphilis at this time. - Perform vaginal swab for gonorrhea, chlamydia, and trichomonas - Await results of the vaginal swab, expected in two days - Initiate treatment based on results if infections are detected        Follow up plan: Return if symptoms worsen or fail to improve.      "

## 2023-10-04 ENCOUNTER — Other Ambulatory Visit (HOSPITAL_COMMUNITY)
Admission: RE | Admit: 2023-10-04 | Discharge: 2023-10-04 | Disposition: A | Discharge status: Home / Self Care | Source: Ambulatory Visit | Attending: Nurse Practitioner | Admitting: Nurse Practitioner

## 2023-10-04 ENCOUNTER — Ambulatory Visit: Admitting: Nurse Practitioner

## 2023-10-04 ENCOUNTER — Encounter: Payer: Self-pay | Admitting: Nurse Practitioner

## 2023-10-04 VITALS — BP 112/70 | Resp 18 | Ht 62.0 in | Wt 173.6 lb

## 2023-10-04 DIAGNOSIS — Z113 Encounter for screening for infections with a predominantly sexual mode of transmission: Secondary | ICD-10-CM | POA: Insufficient documentation

## 2023-10-04 NOTE — Patient Instructions (Signed)
 VISIT SUMMARY:  Today, you came in for a vaginal swab to screen for sexually transmitted infections (STIs), specifically gonorrhea, chlamydia, and trichomonas. You chose not to have blood tests for HIV, hepatitis C, and syphilis at this time. You currently have no symptoms, and the results of the swab are expected in about two days.  YOUR PLAN:  -SEXUALLY TRANSMITTED INFECTION SCREENING: Sexually transmitted infections (STIs) are infections that are passed from one person to another through sexual contact. You have been screened for gonorrhea, chlamydia, and trichomonas using a vaginal swab. We will wait for the results, which should be ready in about two days. If any infections are detected, we will start the appropriate treatment.  INSTRUCTIONS:  Please await the results of your vaginal swab, which should be available in about two days. If any infections are detected, we will contact you to discuss the next steps and initiate treatment if necessary.

## 2023-10-05 ENCOUNTER — Encounter: Payer: Self-pay | Admitting: Nurse Practitioner

## 2023-10-05 LAB — CERVICOVAGINAL ANCILLARY ONLY
Bacterial Vaginitis (gardnerella): NEGATIVE
Candida Glabrata: NEGATIVE
Candida Vaginitis: NEGATIVE
Chlamydia: NEGATIVE
Comment: NEGATIVE
Comment: NEGATIVE
Comment: NEGATIVE
Comment: NEGATIVE
Comment: NEGATIVE
Comment: NORMAL
Neisseria Gonorrhea: NEGATIVE
Trichomonas: NEGATIVE

## 2023-10-10 ENCOUNTER — Encounter: Payer: Self-pay | Admitting: Endocrinology

## 2023-10-10 ENCOUNTER — Other Ambulatory Visit: Payer: Self-pay

## 2023-10-10 ENCOUNTER — Other Ambulatory Visit (HOSPITAL_COMMUNITY): Payer: Self-pay

## 2023-10-10 DIAGNOSIS — E119 Type 2 diabetes mellitus without complications: Secondary | ICD-10-CM

## 2023-10-10 MED ORDER — DEXCOM G6 TRANSMITTER MISC
1.0000 | 3 refills | Status: DC
  Filled 2023-10-10 – 2023-10-17 (×5): qty 1, 90d supply, fill #0

## 2023-10-11 ENCOUNTER — Other Ambulatory Visit (HOSPITAL_COMMUNITY): Payer: Self-pay

## 2023-10-11 ENCOUNTER — Telehealth: Payer: Self-pay

## 2023-10-11 DIAGNOSIS — E1165 Type 2 diabetes mellitus with hyperglycemia: Secondary | ICD-10-CM

## 2023-10-11 NOTE — Telephone Encounter (Signed)
PA needed for Dexcom G6

## 2023-10-11 NOTE — Telephone Encounter (Signed)
 Please clarify how patient is taking insulin- showing 1 injection daily and another weekly.

## 2023-10-13 ENCOUNTER — Encounter: Payer: Self-pay | Admitting: Endocrinology

## 2023-10-13 ENCOUNTER — Encounter (HOSPITAL_COMMUNITY): Payer: Self-pay

## 2023-10-13 ENCOUNTER — Other Ambulatory Visit (HOSPITAL_COMMUNITY): Payer: Self-pay

## 2023-10-13 NOTE — Telephone Encounter (Signed)
 Coverage for CGM's require an "intensive insulin treatment" meaning 3+ injections per day. This patient does not qualify under these standards.

## 2023-10-17 ENCOUNTER — Other Ambulatory Visit (HOSPITAL_COMMUNITY): Payer: Self-pay

## 2023-10-17 MED ORDER — FREESTYLE LIBRE 3 PLUS SENSOR MISC
1.0000 | 3 refills | Status: AC
Start: 2023-10-17 — End: ?
  Filled 2023-10-17: qty 6, 84d supply, fill #0
  Filled 2023-10-18: qty 6, 90d supply, fill #0
  Filled 2023-10-19: qty 2, 30d supply, fill #0
  Filled 2023-11-14: qty 2, 30d supply, fill #1
  Filled 2023-12-14: qty 2, 30d supply, fill #2
  Filled 2024-01-28: qty 2, 30d supply, fill #3

## 2023-10-17 NOTE — Telephone Encounter (Signed)
 We can try if Free style libre 3+ is covered. I will send prescription.

## 2023-10-17 NOTE — Telephone Encounter (Signed)
 Patient is currently taking insulin  once a day only.

## 2023-10-17 NOTE — Addendum Note (Signed)
 Addended by: Jacqulyn Barresi, [place] on: 10/17/2023 11:21 AM   Modules accepted: Orders

## 2023-10-18 ENCOUNTER — Encounter: Payer: Self-pay | Admitting: Endocrinology

## 2023-10-18 ENCOUNTER — Other Ambulatory Visit (HOSPITAL_COMMUNITY): Payer: Self-pay

## 2023-10-19 ENCOUNTER — Other Ambulatory Visit: Payer: Self-pay

## 2023-10-19 ENCOUNTER — Other Ambulatory Visit (HOSPITAL_COMMUNITY): Payer: Self-pay

## 2023-10-19 DIAGNOSIS — E1165 Type 2 diabetes mellitus with hyperglycemia: Secondary | ICD-10-CM

## 2023-10-19 MED ORDER — TRESIBA FLEXTOUCH 200 UNIT/ML ~~LOC~~ SOPN
72.0000 [IU] | PEN_INJECTOR | Freq: Every day | SUBCUTANEOUS | 3 refills | Status: DC
  Filled 2023-10-19: qty 30, 83d supply, fill #0
  Filled 2024-01-05: qty 30, 83d supply, fill #1
  Filled 2024-01-28: qty 30, 83d supply, fill #2

## 2023-10-20 ENCOUNTER — Other Ambulatory Visit (HOSPITAL_COMMUNITY): Payer: Self-pay

## 2023-10-20 ENCOUNTER — Other Ambulatory Visit: Payer: Self-pay

## 2023-10-20 ENCOUNTER — Telehealth: Payer: Self-pay | Admitting: Pharmacy Technician

## 2023-10-20 NOTE — Telephone Encounter (Signed)
 Pharmacy Patient Advocate Encounter   Received notification from CoverMyMeds that prior authorization for FreeStyle Libre 3 Plus Sensor is required/requested.   Insurance verification completed.   The patient is insured through [facility] .   Per test claim: PA required; PA submitted to above mentioned insurance via CoverMyMeds Key/confirmation #/EOC [id number] Status is pending

## 2023-10-20 NOTE — Telephone Encounter (Signed)
 Pharmacy Patient Advocate Encounter  Received notification from [facility] that Prior Authorization for FreeStyle Libre 3 Plus Sensor has been APPROVED from 10/20/2023 to 04/17/2024. Ran test claim, Copay is $0.00. This test claim was processed through [facility]- copay amounts may vary at other pharmacies due to pharmacy/plan contracts, or as the patient moves through the different stages of their insurance plan.   PA #/Case ID/Reference #: [id number]

## 2023-10-26 ENCOUNTER — Encounter: Payer: Self-pay | Admitting: Endocrinology

## 2023-10-26 ENCOUNTER — Telehealth: Payer: Self-pay

## 2023-10-26 NOTE — Telephone Encounter (Signed)
 Patient has concerns about new sensor, pics sent. No redness or rash noted.

## 2023-11-14 ENCOUNTER — Other Ambulatory Visit (HOSPITAL_COMMUNITY): Payer: Self-pay

## 2023-11-28 ENCOUNTER — Encounter: Payer: Self-pay | Admitting: Endocrinology

## 2023-11-28 ENCOUNTER — Other Ambulatory Visit (INDEPENDENT_AMBULATORY_CARE_PROVIDER_SITE_OTHER): Payer: Self-pay | Admitting: Pediatric Endocrinology

## 2023-11-28 ENCOUNTER — Other Ambulatory Visit (HOSPITAL_COMMUNITY): Payer: Self-pay

## 2023-11-28 ENCOUNTER — Other Ambulatory Visit: Payer: Self-pay

## 2023-11-28 DIAGNOSIS — E1165 Type 2 diabetes mellitus with hyperglycemia: Secondary | ICD-10-CM

## 2023-11-28 MED ORDER — ACCU-CHEK GUIDE TEST VI STRP
1.0000 | ORAL_STRIP | Freq: Three times a day (TID) | 12 refills | Status: AC
Start: 2023-11-28 — End: ?
  Filled 2023-11-28: qty 100, 25d supply, fill #0
  Filled 2023-12-18: qty 100, 25d supply, fill #1
  Filled 2024-01-28: qty 100, 25d supply, fill #2
  Filled 2024-03-24: qty 100, 25d supply, fill #3
  Filled 2024-05-19: qty 100, 25d supply, fill #4

## 2023-11-28 NOTE — Telephone Encounter (Signed)
 I am sorry to hear about issues with the CGM even with freestyle libre 3+.  This is not common, however for some patients have this type of experience that you are having.  You can sometimes have a better luck if you use extra tape for example tegaderm over-the-counter to help better adhesive to prevent falling off.  In regard to calibration, inaccuracy of the reading, sometime it can be related to the sensor issue with compression and sometimes related to connectivity issue.  You can also talk with company representative if they have any troubleshoot.  Holly Ruxin Ransome, MD [facility] Endocrin[facility] [address] [place], [address] [place], [place] [address] Phone # [phone]

## 2023-12-02 ENCOUNTER — Other Ambulatory Visit (HOSPITAL_COMMUNITY): Payer: Self-pay

## 2023-12-02 ENCOUNTER — Other Ambulatory Visit (INDEPENDENT_AMBULATORY_CARE_PROVIDER_SITE_OTHER): Payer: Self-pay | Admitting: Endocrinology

## 2023-12-02 DIAGNOSIS — E1165 Type 2 diabetes mellitus with hyperglycemia: Secondary | ICD-10-CM

## 2023-12-05 ENCOUNTER — Other Ambulatory Visit (HOSPITAL_COMMUNITY): Payer: Self-pay

## 2023-12-05 ENCOUNTER — Other Ambulatory Visit: Payer: Self-pay

## 2023-12-05 MED ORDER — ACCU-CHEK GUIDE W/DEVICE KIT
PACK | 0 refills | Status: DC
  Filled 2023-12-05: qty 1, 90d supply, fill #0
  Filled 2023-12-05: qty 1, fill #0
  Filled 2023-12-06: qty 1, 30d supply, fill #0
  Filled 2023-12-11: qty 1, fill #0
  Filled 2024-01-01: qty 1, 30d supply, fill #0

## 2023-12-06 ENCOUNTER — Other Ambulatory Visit (HOSPITAL_COMMUNITY): Payer: Self-pay

## 2023-12-12 ENCOUNTER — Other Ambulatory Visit: Payer: Self-pay

## 2023-12-12 ENCOUNTER — Other Ambulatory Visit (HOSPITAL_COMMUNITY): Payer: Self-pay

## 2023-12-16 ENCOUNTER — Ambulatory Visit: Admitting: Endocrinology

## 2023-12-19 ENCOUNTER — Other Ambulatory Visit: Payer: Self-pay

## 2023-12-19 ENCOUNTER — Other Ambulatory Visit (HOSPITAL_COMMUNITY): Payer: Self-pay

## 2024-01-02 ENCOUNTER — Other Ambulatory Visit (HOSPITAL_COMMUNITY): Payer: Self-pay

## 2024-01-02 ENCOUNTER — Other Ambulatory Visit: Payer: Self-pay

## 2024-01-10 ENCOUNTER — Other Ambulatory Visit (HOSPITAL_COMMUNITY): Payer: Self-pay

## 2024-01-11 ENCOUNTER — Emergency Department (HOSPITAL_BASED_OUTPATIENT_CLINIC_OR_DEPARTMENT_OTHER)
Admission: EM | Admit: 2024-01-11 | Discharge: 2024-01-11 | Disposition: A | Discharge status: Home / Self Care | Attending: Emergency Medicine | Admitting: Emergency Medicine

## 2024-01-11 ENCOUNTER — Other Ambulatory Visit (HOSPITAL_COMMUNITY): Payer: Self-pay

## 2024-01-11 ENCOUNTER — Encounter (HOSPITAL_BASED_OUTPATIENT_CLINIC_OR_DEPARTMENT_OTHER): Payer: Self-pay | Admitting: Emergency Medicine

## 2024-01-11 DIAGNOSIS — E119 Type 2 diabetes mellitus without complications: Secondary | ICD-10-CM | POA: Insufficient documentation

## 2024-01-11 DIAGNOSIS — L02412 Cutaneous abscess of left axilla: Secondary | ICD-10-CM | POA: Diagnosis present

## 2024-01-11 DIAGNOSIS — L0291 Cutaneous abscess, unspecified: Secondary | ICD-10-CM

## 2024-01-11 DIAGNOSIS — Z794 Long term (current) use of insulin: Secondary | ICD-10-CM | POA: Insufficient documentation

## 2024-01-11 MED ORDER — OXYCODONE-ACETAMINOPHEN 5-325 MG PO TABS
1.0000 | ORAL_TABLET | Freq: Once | ORAL | Status: AC
  Administered 2024-01-11: 1 via ORAL
  Filled 2024-01-11: qty 1

## 2024-01-11 MED ORDER — DOXYCYCLINE HYCLATE 100 MG PO CAPS
100.0000 mg | ORAL_CAPSULE | Freq: Two times a day (BID) | ORAL | 0 refills | Status: DC
  Filled 2024-01-11: qty 20, 10d supply, fill #0

## 2024-01-11 MED ORDER — LIDOCAINE-EPINEPHRINE (PF) 2 %-1:200000 IJ SOLN
10.0000 mL | Freq: Once | INTRAMUSCULAR | Status: AC
  Administered 2024-01-11: 10 mL
  Filled 2024-01-11: qty 20

## 2024-01-11 NOTE — Discharge Instructions (Signed)
 You were seen in the emergency department today for management of your abscess.  We have drained the area and cleaned it. The area will likely continue to drain over the next 48 hours, you may have some bleeding as well. This is to be expected. I would like you to have the wound checked in 2-3 days. This can be done by any doctor's office, urgent care, or emergency department. This is to make sure the area hasn't closed too soon and is healing appropriately. Try to keep the area as clean and dry as possible. You can also continue to apply warm compresses to the area or soak in warm water  for 15-20 minutes 3-4 times daily. This can help with wound healing and promote drainage.   You can also clean the area regularly with soap and water  which can help ensure it heals appropriately from the inside out. If you can tolerate the discomfort, you can also use a Q-tip or washcloth to clean the area as well. Avoid cleaners such as peroxide and alcohol as these can damage wound tissue and slow healing.  Keep the area covered with a clean gauze dressing or bandage.  Change this dressing if it becomes wet or dirty or at least once daily.  I am placing you on a course of antibiotics. It is important you finish the entire course! You can take ibuprofen  or tylenol  as needed for pain.   After this area has healed fully and is no longer an open wound, you can use an antiseptic chlorhexidine (brand name Hibiclens) soap from the pharmacy 1-2 x per month in the areas where abscesses are most likely to form (armpits, buttocks, groin). This can help to manage the skin bacteria that causes these abscesses to form. This soap can dry your skin out so use it sparingly.  Additionally, given that you have had recurrent abscesses in this region, you may need to see a surgeon to have the area removed to prevent another recurrence.  I have given you a referral with a number to call to schedule appointment for follow-up.  Please call at  your earliest convenience.  Return if development of worsening swelling, pain, fevers, or any new or worsening symptoms.

## 2024-01-11 NOTE — ED Notes (Addendum)
 Dc paperwork given and verbally understood... Pt aware and understood No drinking/driving due to the meds that were given.SABRASABRA

## 2024-01-11 NOTE — ED Provider Notes (Cosign Needed Addendum)
 [place] EMERGENCY DEPARTMENT AT [facility] Provider Note   CSN: [id number] Arrival date & time: 01/11/24  1308     Patient presents with: Cyst   Holly [name] is a 23 y.o. female.   Patient with history of T2DM, HS presents today with complaints of abscess. She reports that same is located in her left axilla and has been present for the past 1 week. It has not been draining. She states that she has had abscesses in this region previously which have required incision and drainage before. She is requesting same. She is not currently on any medications for her HS. Denies fevers or chills. Patient reports her blood sugars have been well controlled.  The history is provided by the patient. No language interpreter was used.       Prior to Admission medications   Medication Sig Start Date End Date Taking? Authorizing Provider  Accu-Chek FastClix Lancets MISC Use to check blood sugar 3 times a day 02/22/23   Thapa, [place], MD  Blood Glucose Monitoring Suppl (ACCU-CHEK GUIDE) w/Device KIT Use to check sugars up to 3 times daily. 02/22/23   Thapa, [place], MD  Blood Glucose Monitoring Suppl (ACCU-CHEK GUIDE) w/Device KIT Use to check sugars up to 6 times daily. 12/05/23   Thapa, [place], MD  Clindamycin -Benzoyl Per, Refr, gel Apply 1 application  topically 2 (two) times daily. 02/24/23   Gareth Mliss FALCON, FNP  Continuous Glucose Sensor (DEXCOM G6 SENSOR) MISC USE 1 SENSOR EVERY 10 DAYS AS DIRECTED 02/24/23   Pender, Julie F, FNP  Continuous Glucose Sensor (FREESTYLE LIBRE 3 PLUS SENSOR) MISC Change every 15 days. 10/17/23   Thapa, [place], MD  Continuous Glucose Transmitter (DEXCOM G6 TRANSMITTER) MISC 1 EACH BUT DOES NOT APPLY ROUTE EVERY 3 MONTHS. 02/17/23   Thapa, [place], MD  Continuous Glucose Transmitter (DEXCOM G6 TRANSMITTER) MISC Use as directed, replace every 3 months 10/10/23   Thapa, [place], MD  empagliflozin  (JARDIANCE ) 25 MG TABS tablet Take 1 tablet (25 mg total) by mouth  daily before breakfast. 08/15/23   Thapa, [place], MD  fluconazole  (DIFLUCAN ) 150 MG tablet Take 1 tablet (150 mg total) by mouth every three (3) days as needed. Repeat if needed 03/03/23   Arloa Suzen RAMAN, NP  glucose blood (ACCU-CHEK GUIDE TEST) test strip Use to test blood glucose before meals and at bedtime 11/28/23   Thapa, [place], MD  insulin  degludec (TRESIBA  FLEXTOUCH) 200 UNIT/ML FlexTouch Pen Inject 72 Units into the skin daily. 10/19/23   Thapa, [place], MD  Insulin  Pen Needle (BD PEN NEEDLE NANO 2ND GEN) 32G X 4 MM MISC USE TO INJECT INSULIN  6 TIMES DAILY 06/13/23   Thapa, [place], MD  tirzepatide  (MOUNJARO ) 15 MG/0.5ML Pen Inject 15 mg into the skin once a week. 08/16/23   Pender, Julie F, FNP    Allergies: Patient has no known allergies.    Review of Systems  All other systems reviewed and are negative.   Updated Vital Signs BP (!) 137/91   Pulse 100   Temp 98.8 F (37.1 C) (Oral)   Resp 15   SpO2 100%   Physical Exam Vitals and nursing note reviewed.  Constitutional:      General: She is not in acute distress.    Appearance: Normal appearance. She is normal weight. She is not ill-appearing, toxic-appearing or diaphoretic.  HENT:     Head: Normocephalic and atraumatic.  Cardiovascular:     Rate and Rhythm: Normal rate.  Pulmonary:  Effort: Pulmonary effort is normal. No respiratory distress.  Musculoskeletal:        General: Normal range of motion.     Cervical back: Normal range of motion.  Skin:    General: Skin is warm and dry.     Comments: Left axilla with 1 cm x 2 cm area of fluctuance with surrounding induration. HS skin changes noted. No active drainage or crepitus  Neurological:     General: No focal deficit present.     Mental Status: She is alert.  Psychiatric:        Mood and Affect: Mood normal.        Behavior: Behavior normal.     (all labs ordered are listed, but only abnormal results are displayed) Labs Reviewed - No data to  display  EKG: None  Radiology: No results found.   SABRAUltrasound ED Soft Tissue  Date/Time: 01/11/2024 2:59 PM  Performed by: Nora Lauraine LABOR, PA-C Authorized by: Nora Lauraine LABOR, PA-C   Procedure details:    Indications: localization of abscess and evaluate for cellulitis     Transverse view:  Visualized   Longitudinal view:  Visualized   Images: not archived   Location:    Location: axilla     Side:  Left Findings:     abscess present    cellulitis present .Incision and Drainage  Date/Time: 01/11/2024 2:59 PM  Performed by: Nora Lauraine LABOR, PA-C Authorized by: Baylen Dea A, PA-C   Consent:    Consent obtained:  Verbal   Consent given by:  Patient   Risks, benefits, and alternatives were discussed: yes     Risks discussed:  Bleeding, incomplete drainage, pain, damage to other organs and infection   Alternatives discussed:  No treatment, delayed treatment, alternative treatment, observation and referral Universal protocol:    Procedure explained and questions answered to patient or proxy's satisfaction: yes     Patient identity confirmed:  Verbally with patient Location:    Type:  Abscess   Size:  1 cm x 2 cm   Location: left axilla. Pre-procedure details:    Skin preparation:  Povidone-iodine Anesthesia:    Anesthesia method:  Local infiltration   Local anesthetic:  Lidocaine  2% WITH epi Procedure type:    Complexity:  Simple Procedure details:    Incision types:  Cruciate   Wound management:  Probed and deloculated   Drainage:  Purulent   Drainage amount:  Copious   Wound treatment:  Wound left open   Packing materials:  None Post-procedure details:    Procedure completion:  Tolerated well, no immediate complications    Medications Ordered in the ED  lidocaine -EPINEPHrine  (XYLOCAINE  W/EPI) 2 %-1:200000 (PF) injection 10 mL (has no administration in time range)  oxyCODONE -acetaminophen  (PERCOCET/ROXICET) 5-325 MG per tablet 1 tablet (has no administration  in time range)                                    Medical Decision Making Risk Prescription drug management.   Patient presents today with complaints of left axilla abscess x 1 week. She is afebrile, non-toxic appearing, and in no acute distress with reassuring vital signs. Physical exam reveals 1 cm x 2 cm area of fluctuance with surrounding induration to the left axilla, viewed on ultrasound per above procedure with drainable collection present. Chart reviewed, looks like she has been here for similar symptoms previously and had I&D.  Discussed options with patient, I&D vs warm compresses and antibiotics, patient would prefer to have I&D. Skin abscess amenable to incision and drainage per above procedure which was well tolerated. Abscess was not large enough to warrant packing or drain,  wound recheck in 2 days. Encouraged home warm soaks and flushing.  Will send for doxycycline  as well. Evaluation and diagnostic testing in the emergency department does not suggest an emergent condition requiring admission or immediate intervention beyond what has been performed at this time.  Plan for discharge with close PCP follow-up.  Patient is understanding and amenable with plan, educated on red flag symptoms that would prompt immediate return.  Patient discharged in stable condition.  Final diagnoses:  Abscess    ED Discharge Orders          Ordered    doxycycline  (VIBRAMYCIN ) 100 MG capsule  2 times daily        01/11/24 1508          An After Visit Summary was printed and given to the patient.      Classie Weng A, PA-C 01/11/24 1511    Leiyah Maultsby, Lauraine LABOR, PA-C 01/11/24 1607    Patsey Lot, MD 01/12/24 [phone]

## 2024-01-11 NOTE — ED Triage Notes (Signed)
 C/o cyst in left armpit. 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 Denies fever.

## 2024-01-12 ENCOUNTER — Other Ambulatory Visit (HOSPITAL_COMMUNITY): Payer: Self-pay

## 2024-01-16 ENCOUNTER — Ambulatory Visit: Admitting: Nurse Practitioner

## 2024-01-16 ENCOUNTER — Encounter: Payer: Self-pay | Admitting: Nurse Practitioner

## 2024-01-16 VITALS — BP 118/72 | HR 96 | Temp 98.4°F | Resp 18 | Ht 62.0 in | Wt 167.9 lb

## 2024-01-16 DIAGNOSIS — Z5189 Encounter for other specified aftercare: Secondary | ICD-10-CM | POA: Diagnosis not present

## 2024-01-16 DIAGNOSIS — L732 Hidradenitis suppurativa: Secondary | ICD-10-CM | POA: Diagnosis not present

## 2024-01-16 NOTE — Progress Notes (Signed)
 BP 118/72   Pulse 96   Temp 98.4 F (36.9 C)   Resp 18   Ht 5' 2 (1.575 m)   Wt 167 lb 14.4 oz (76.2 kg)   SpO2 96%   BMI 30.71 kg/m    Subjective:    Patient ID: Holly [name], female    DOB: 2001/01/13, 23 y.o.   MRN: [id number]  HPI: Holly [name] is [name] 23 y.o. female  Chief Complaint  Patient presents with   Medical Management of Chronic Issues    ER f/u    Abscess    HX of HS wants referral    Discussed the use of AI scribe software for clinical note transcription with the patient, who gave verbal consent to proceed.  History of Present Illness Holly [name] is [name] 23 year old female with diabetes who presents for ER follow-up of [name] left axillary abscess.  Left axillary abscess - Evaluated in the emergency department on January 11, 2024 for [name] left axillary abscess - Incision and drainage performed in the emergency department - Started on doxycycline  following the procedure - Initial post-procedure course included soreness and erythema of the surrounding area for 2-3 days, now resolved  Hidradenitis suppurativa and recurrent skin infections - History of hidradenitis suppurativa - Previous episodes of cellulitis, with one episode severe enough to result in scarring - Under dermatology care at [facility] for ongoing skin conditions  Diabetes mellitus - Diabetes managed by endocrinology - Most recent hemoglobin A1c was 7.2% on August 15, 2023 - No new symptoms related to diabetes at this visit      ER note: Patient with history of T2DM, HS presents today with complaints of abscess. She reports that same is located in her left axilla and has been present for the past 1 week. It has not been draining. She states that she has had abscesses in this region previously which have required incision and drainage before. She is requesting same. She is not currently on any medications for her HS. Denies fevers or chills. Patient reports her blood  sugars have been well controlled.  SABRAUltrasound ED Soft Tissue   Date/Time: 01/11/2024 2:59 PM   Performed by: Holly [name] LABOR, Holly [name] Authorized by: Holly [name] LABOR, Holly [name]   Procedure details:    Indications: localization of abscess and evaluate for cellulitis     Transverse view:  Visualized   Longitudinal view:  Visualized   Images: not archived   Location:    Location: axilla     Side:  Left Findings:     abscess present    cellulitis present   .Incision and Drainage   Date/Time: 01/11/2024 2:59 PM   Performed by: Holly [name] LABOR, Holly [name] Authorized by: Holly [name], Holly [name], Holly [name]   Consent:    Consent obtained:  Verbal   Consent given by:  Patient   Risks, benefits, and alternatives were discussed: yes     Risks discussed:  Bleeding, incomplete drainage, pain, damage to other organs and infection   Alternatives discussed:  No treatment, delayed treatment, alternative treatment, observation and referral Universal protocol:    Procedure explained and questions answered to patient or proxy's satisfaction: yes     Patient identity confirmed:  Verbally with patient Location:    Type:  Abscess   Size:  1 cm x 2 cm   Location: left axilla. Pre-procedure details:    Skin preparation:  Povidone-iodine Anesthesia:    Anesthesia method:  Local infiltration   Local anesthetic:  Lidocaine   2% WITH epi Procedure type:    Complexity:  Simple Procedure details:    Incision types:  Cruciate   Wound management:  Probed and deloculated   Drainage:  Purulent   Drainage amount:  Copious   Wound treatment:  Wound left open   Packing materials:  None Post-procedure details:    Procedure completion:  Tolerated well, no immediate complications  Patient presents today with complaints of left axilla abscess x 1 week. She is afebrile, non-toxic appearing, and in no acute distress with reassuring vital signs. Physical exam reveals 1 cm x 2 cm area of fluctuance with surrounding induration to the left  axilla, viewed on ultrasound per above procedure with drainable collection present. Chart reviewed, looks like she has been here for similar symptoms previously and had I&D. Discussed options with patient, I&D vs warm compresses and antibiotics, patient would prefer to have I&D. Skin abscess amenable to incision and drainage per above procedure which was well tolerated. Abscess was not large enough to warrant packing or drain,  wound recheck in 2 days. Encouraged home warm soaks and flushing.  Will send for doxycycline  as well. Evaluation and diagnostic testing in the emergency department does not suggest an emergent condition requiring admission or immediate intervention beyond what has been performed at this time.  Plan for discharge with close PCP follow-up.  Patient is understanding and amenable with plan, educated on red flag symptoms that would prompt immediate return.  Patient discharged in stable condition.      01/16/2024   10:14 AM 10/04/2023    8:49 AM 03/23/2023    9:03 AM  Depression screen PHQ 2/9  Decreased Interest 0 0 0  Down, Depressed, Hopeless 0 0 0  PHQ - 2 Score 0 0 0  Altered sleeping 0 0 0  Tired, decreased energy 0 0 0  Change in appetite 0 0 0  Feeling bad or failure about yourself  0 0 0  Trouble concentrating 0 0 0  Moving slowly or fidgety/restless 0 0 0  Suicidal thoughts 0 0 0  PHQ-9 Score 0 0 0  Difficult doing work/chores Not difficult at all Not difficult at all     Relevant past medical, surgical, family and social history reviewed and updated as indicated. Interim medical history since our last visit reviewed. Allergies and medications reviewed and updated.  Review of Systems  Ten systems reviewed and is negative except as mentioned in HPI      Objective:     BP 118/72   Pulse 96   Temp 98.4 F (36.9 C)   Resp 18   Ht 5' 2 (1.575 m)   Wt 167 lb 14.4 oz (76.2 kg)   SpO2 96%   BMI 30.71 kg/m    Wt Readings from Last 3 Encounters:  01/16/24  167 lb 14.4 oz (76.2 kg)  10/04/23 173 lb 9.6 oz (78.7 kg)  08/15/23 179 lb (81.2 kg)    Physical Exam Physical Exam GENERAL: Alert, cooperative, well developed, no acute distress. HEENT: Normocephalic, normal oropharynx, moist mucous membranes. CHEST: Clear to auscultation bilaterally, no wheezes, rhonchi, or crackles. CARDIOVASCULAR: Normal heart rate and rhythm, S1 and S2 normal without murmurs. ABDOMEN: Soft, non-tender, non-distended, without organomegaly, normal bowel sounds. EXTREMITIES: No cyanosis or edema. NEUROLOGICAL: Cranial nerves grossly intact, moves all extremities without gross motor or sensory deficit. SKIN: Incision on left axilla healing well, no signs of infection.           Assessment & Plan:  Problem List Items Addressed This Visit       Musculoskeletal and Integument   Hidradenitis suppurativa - Primary   Other Visit Diagnoses       Wound check, abscess            Assessment and Plan Assessment & Plan Left axillary abscess, status post I&D with cellulitis Status post incision and drainage of left axillary abscess with associated cellulitis. The abscess was not large, and the surrounding cellulitis has improved significantly. Initially, the area was sore and red for [name] few days post-procedure, but these symptoms have resolved. The condition is improving with current treatment. - Continue doxycycline  as prescribed  Hidradenitis suppurativa Chronic condition with multiple scars and previous episodes of cellulitis. Previously managed by dermatology at [facility] dermatology for follow-up appointment - If dermatology at [facility] does not accept current insurance, explore alternative dermatology options  Type 2 diabetes mellitus Managed by endocrinology. Last HbA1c was 7.2% in February 2025.        Follow up plan: Return if symptoms worsen or fail to improve.

## 2024-01-19 ENCOUNTER — Encounter: Payer: Self-pay | Admitting: Nurse Practitioner

## 2024-01-20 ENCOUNTER — Telehealth: Payer: Self-pay

## 2024-01-20 ENCOUNTER — Encounter: Payer: Self-pay | Admitting: Nurse Practitioner

## 2024-01-20 ENCOUNTER — Ambulatory Visit (INDEPENDENT_AMBULATORY_CARE_PROVIDER_SITE_OTHER): Admitting: Nurse Practitioner

## 2024-01-20 VITALS — BP 124/72 | HR 89 | Temp 97.9°F | Resp 18 | Ht 62.5 in | Wt 165.1 lb

## 2024-01-20 DIAGNOSIS — Z0000 Encounter for general adult medical examination without abnormal findings: Secondary | ICD-10-CM

## 2024-01-20 NOTE — Progress Notes (Signed)
 Name: Holly [name]   MRN: [id number]    DOB: [birth year]   Date:01/20/2024       Progress Note  Subjective  Chief Complaint  Chief Complaint  Patient presents with   Annual Exam    HPI  Patient presents for annual CPE. Discussed the use of AI scribe software for clinical note transcription with the patient, who gave verbal consent to proceed.  History of Present Illness Holly [name] is a 23 year old female with diabetes who presents for an annual physical exam.  Glycemic monitoring difficulties - Freestyle glucose sensor frequently detaches, especially during activities such as yoga in a sauna - Freestyle sensor in use since the start of the year - Freestyle sensor less accurate than previous Dexcom sensor - Freestyle sensor often indicates low blood sugar inaccurately, resulting in nocturnal alarms - Calibration with finger stick readings is not possible with Freestyle sensor - Insurance discontinued coverage for Dexcom sensor due to reduced insulin  usage  Cutaneous hypersensitivity reaction - Recent skin allergy following a facial at a spa - Significant facial swelling and erythema occurred after the facial - Spa did not follow up as promised; required in-person visit to address the issue - Received a free service as compensation, but was not credited as promised - Seeking a note to cancel spa membership due to unprofessional experience  Hidradenitis suppurativa - Awaiting dermatology referral for hidradenitis suppurativa - Informed of a two-year waitlist to see dermatologist - May be able to see an assistant sooner  Lifestyle and sexual activity - Practices yoga, Pilates, and basketball - Not currently sexually active    Diet: well balanced diet Exercise: yoga, pilates, walking  Sleep: 6-8 hours Last dental exam:a couple months ago Last eye exam: beginning of the year  Flowsheet Row Office Visit from 01/20/2024 in [facility]  AUDIT-C Score 1   Depression: Phq 9 is  negative    01/16/2024   10:14 AM 10/04/2023    8:49 AM 03/23/2023    9:03 AM 02/24/2023    1:44 PM 01/07/2023    8:54 AM  Depression screen PHQ 2/9  Decreased Interest 0 0 0 0 0  Down, Depressed, Hopeless 0 0 0 0 0  PHQ - 2 Score 0 0 0 0 0  Altered sleeping 0 0 0  1  Tired, decreased energy 0 0 0  0  Change in appetite 0 0 0  0  Feeling bad or failure about yourself  0 0 0  0  Trouble concentrating 0 0 0  0  Moving slowly or fidgety/restless 0 0 0  0  Suicidal thoughts 0 0 0  0  PHQ-9 Score 0 0 0  1  Difficult doing work/chores Not difficult at all Not difficult at all   Not difficult at all   Hypertension: BP Readings from Last 3 Encounters:  01/20/24 124/72  01/16/24 118/72  01/11/24 136/78   Obesity: Wt Readings from Last 3 Encounters:  01/20/24 165 lb 1.6 oz (74.9 kg)  01/16/24 167 lb 14.4 oz (76.2 kg)  10/04/23 173 lb 9.6 oz (78.7 kg)   BMI Readings from Last 3 Encounters:  01/20/24 29.72 kg/m  01/16/24 30.71 kg/m  10/04/23 31.75 kg/m     Vaccines:  HPV: up to at age 9 , ask insurance if age between 70-45  Shingrix: 18-64 yo and ask insurance if covered when patient above 71 yo Pneumonia:  educated and discussed with patient. Flu:  educated and discussed  with patient.  Hep C Screening: completed STD testing and prevention (HIV/chl/gon/syphilis): completed Intimate partner violence: none Sexual History :not currently sexually active Menstrual History/LMP/Abnormal Bleeding: [facility]: 12/24/2023 Incontinence Symptoms: none  Breast cancer:  - Last Mammogram: does not qualify - BRCA gene screening: none  Osteoporosis: Discussed high calcium and vitamin D supplementation, weight bearing exercises  Cervical cancer screening: 01/07/2023  Skin cancer: Discussed monitoring for atypical lesions  Colorectal cancer: does not qualify   Lung cancer:   Low Dose CT Chest recommended if Age 61-80 years, 20 pack-year  currently smoking OR have quit w/in 15years. Patient does not qualify.   ECG: 10/05/2022  Advanced Care Planning: A voluntary discussion about advance care planning including the explanation and discussion of advance directives.  Discussed health care proxy and Living will, and the patient was able to identify a health care proxy as sister.  Patient does not have a living will at present time. If patient does have living will, I have requested they bring this to the clinic to be scanned in to their chart.  Lipids: Lab Results  Component Value Date   CHOL 145 11/17/2022   CHOL 148 09/29/2021   CHOL 139 09/02/2020   Lab Results  Component Value Date   HDL 43 (L) 11/17/2022   HDL 39 (L) 09/29/2021   HDL 37 (L) 09/02/2020   Lab Results  Component Value Date   LDLCALC 85 11/17/2022   LDLCALC 92 09/29/2021   LDLCALC 83 09/02/2020   Lab Results  Component Value Date   TRIG 82 11/17/2022   TRIG 77 09/29/2021   TRIG 101 (H) 09/02/2020   Lab Results  Component Value Date   CHOLHDL 3.4 11/17/2022   CHOLHDL 3.8 09/29/2021   CHOLHDL 3.8 09/02/2020   No results found for: LDLDIRECT  Glucose: Glucose, Bld  Date Value Ref Range Status  02/24/2023 131 (H) 70 - 99 mg/dL Final  94/77/7975 817 (H) 65 - 99 mg/dL Final    Comment:    .            Fasting reference interval . For someone without known diabetes, a glucose value >125 mg/dL indicates that they may have diabetes and this should be confirmed with a follow-up test. .   10/04/2022 137 (H) 70 - 99 mg/dL Final    Comment:    Glucose reference range applies only to samples taken after fasting for at least 8 hours.   Glucose-Capillary  Date Value Ref Range Status  10/04/2022 142 (H) 70 - 99 mg/dL Final    Comment:    Glucose reference range applies only to samples taken after fasting for at least 8 hours.  10/02/2022 165 (H) 70 - 99 mg/dL Final    Comment:    Glucose reference range applies only to samples taken after  fasting for at least 8 hours.  04/08/2020 179 (H) 70 - 99 mg/dL Final    Comment:    Glucose reference range applies only to samples taken after fasting for at least 8 hours.    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   'Light-for-dates' infant with signs of fetal malnutrition 12/30/2021   Uncontrolled type 2 diabetes mellitus with hyperglycemia (HCC) 06/04/2021   Insulin  dose changed (HCC) 11/23/2018   Generalized anxiety disorder 08/09/2018   Type 2 diabetes mellitus treated with insulin  (HCC) 01/12/2017   Hidradenitis suppurativa 12/12/2016   Anisocoria 12/12/2016  Undiagnosed cardiac murmurs 12/12/2016   Obesity, unspecified 12/04/2013   Mild acne 12/04/2013   Change in voice 11/01/2013   Panic attack 11/01/2013   Sinus tachycardia 11/01/2013    Past Surgical History:  Procedure Laterality Date   MOUTH SURGERY      Family History  Problem Relation Age of Onset   Diabetes Mother     Social History   Socioeconomic History   Marital status: Single    Spouse name: Not on file   Number of children: Not on file   Years of education: Not on file   Highest education level: Not on file  Occupational History   Not on file  Tobacco Use   Smoking status: Never    Passive exposure: Never   Smokeless tobacco: Never  Vaping Use   Vaping status: Never Used  Substance and Sexual Activity   Alcohol use: Never   Drug use: Never   Sexual activity: Not Currently  Other Topics Concern   Not on file  Social History Narrative   No College. Not working right now   Social Drivers of Corporate investment banker Strain: Low Risk  (01/20/2024)   Overall Financial Resource Strain (CARDIA)    Difficulty of Paying Living Expenses: Not hard at all  Food Insecurity: No Food Insecurity (01/20/2024)   Hunger Vital Sign    Worried About Running Out of Food in the Last Year: Never true    Ran  Out of Food in the Last Year: Never true  Transportation Needs: No Transportation Needs (01/20/2024)   PRAPARE - Administrator, Civil Service (Medical): No    Lack of Transportation (Non-Medical): No  Physical Activity: Insufficiently Active (01/20/2024)   Exercise Vital Sign    Days of Exercise per Week: 4 days    Minutes of Exercise per Session: 30 min  Stress: No Stress Concern Present (01/20/2024)   [employer] of Occupational Health - Occupational Stress Questionnaire    Feeling of Stress: Not at all  Social Connections: Moderately Integrated (01/07/2023)   Social Connection and Isolation Panel    Frequency of Communication with Friends and Family: More than three times a week    Frequency of Social Gatherings with Friends and Family: More than three times a week    Attends Religious Services: More than 4 times per year    Active Member of [employer] or Organizations: Yes    Attends Banker Meetings: More than 4 times per year    Marital Status: Never married  Intimate Partner Violence: Not At Risk (01/20/2024)   Humiliation, Afraid, Rape, and Kick questionnaire    Fear of Current or Ex-Partner: No    Emotionally Abused: No    Physically Abused: No    Sexually Abused: No     Current Outpatient Medications:    Accu-Chek FastClix Lancets MISC, Use to check blood sugar 3 times a day, Disp: 300 each, Rfl: 5   Blood Glucose Monitoring Suppl (ACCU-CHEK GUIDE) w/Device KIT, Use to check sugars up to 3 times daily., Disp: 1 kit, Rfl: 0   Blood Glucose Monitoring Suppl (ACCU-CHEK GUIDE) w/Device KIT, Use to check sugars up to 6 times daily., Disp: 1 kit, Rfl: 0   Clindamycin -Benzoyl Per, Refr, gel, Apply 1 application  topically 2 (two) times daily., Disp: 45 g, Rfl: 1   Continuous Glucose Sensor (DEXCOM G6 SENSOR) MISC, USE 1 SENSOR EVERY 10 DAYS AS DIRECTED, Disp: 3 each, Rfl: 10   Continuous  Glucose Sensor (FREESTYLE LIBRE 3 PLUS SENSOR) MISC, Change every 15  days., Disp: 6 each, Rfl: 3   Continuous Glucose Transmitter (DEXCOM G6 TRANSMITTER) MISC, 1 EACH BUT DOES NOT APPLY ROUTE EVERY 3 MONTHS., Disp: 1 each, Rfl: 3   Continuous Glucose Transmitter (DEXCOM G6 TRANSMITTER) MISC, Use as directed, replace every 3 months, Disp: 1 each, Rfl: 3   doxycycline  (VIBRAMYCIN ) 100 MG capsule, Take 1 capsule (100 mg total) by mouth 2 (two) times daily., Disp: 20 capsule, Rfl: 0   empagliflozin  (JARDIANCE ) 25 MG TABS tablet, Take 1 tablet (25 mg total) by mouth daily before breakfast., Disp: 90 tablet, Rfl: 4   glucose blood (ACCU-CHEK GUIDE TEST) test strip, Use to test blood glucose before meals and at bedtime, Disp: 300 each, Rfl: 12   insulin  degludec (TRESIBA  FLEXTOUCH) 200 UNIT/ML FlexTouch Pen, Inject 72 Units into the skin daily., Disp: 36 mL, Rfl: 3   Insulin  Pen Needle (BD PEN NEEDLE NANO 2ND GEN) 32G X 4 MM MISC, USE TO INJECT INSULIN  6 TIMES DAILY, Disp: 200 each, Rfl: 5   tirzepatide  (MOUNJARO ) 15 MG/0.5ML Pen, Inject 15 mg into the skin once a week., Disp: 2 mL, Rfl: 5   fluconazole  (DIFLUCAN ) 150 MG tablet, Take 1 tablet (150 mg total) by mouth every three (3) days as needed. Repeat if needed, Disp: 2 tablet, Rfl: 0  No Known Allergies   ROS  Constitutional: Negative for fever or weight change.  Respiratory: Negative for cough and shortness of breath.   Cardiovascular: Negative for chest pain or palpitations.  Gastrointestinal: Negative for abdominal pain, no bowel changes.  Musculoskeletal: Negative for gait problem or joint swelling.  Skin: Negative for rash.  Neurological: Negative for dizziness or headache.  No other specific complaints in a complete review of systems (except as listed in HPI above).   Objective  Vitals:   01/20/24 1036  BP: 124/72  Pulse: 89  Resp: 18  Temp: 97.9 F (36.6 C)  SpO2: 95%  Weight: 165 lb 1.6 oz (74.9 kg)  Height: 5' 2.5 (1.588 m)    Body mass index is 29.72 kg/m.  Physical Exam Vitals  reviewed.  Constitutional:      Appearance: Normal appearance.  HENT:     Head: Normocephalic.     Right Ear: Tympanic membrane normal.     Left Ear: Tympanic membrane normal.     Nose: Nose normal.  Eyes:     Extraocular Movements: Extraocular movements intact.     Conjunctiva/sclera: Conjunctivae normal.     Pupils: Pupils are equal, round, and reactive to light.  Neck:     Thyroid: No thyroid mass, thyromegaly or thyroid tenderness.  Cardiovascular:     Rate and Rhythm: Normal rate and regular rhythm.     Pulses: Normal pulses.     Heart sounds: Normal heart sounds.  Pulmonary:     Effort: Pulmonary effort is normal.     Breath sounds: Normal breath sounds.  Abdominal:     General: Bowel sounds are normal.     Palpations: Abdomen is soft.  Musculoskeletal:        General: Normal range of motion.     Cervical back: Normal range of motion and neck supple.     Right lower leg: No edema.     Left lower leg: No edema.  Skin:    General: Skin is warm and dry.     Capillary Refill: Capillary refill takes less than 2 seconds.  Neurological:  General: No focal deficit present.     Mental Status: She is alert and oriented to person, place, and time. Mental status is at baseline.  Psychiatric:        Mood and Affect: Mood normal.        Behavior: Behavior normal.        Thought Content: Thought content normal.        Judgment: Judgment normal.      No results found for this or any previous visit (from the past 2160 hours).  Diabetic Foot Exam: Diabetic Foot Exam - Simple   Simple Foot Form Diabetic Foot exam was performed with the following findings: Yes 01/20/2024 10:41 AM  Visual Inspection No deformities, no ulcerations, no other skin breakdown bilaterally: Yes Sensation Testing Intact to touch and monofilament testing bilaterally: Yes Pulse Check Posterior Tibialis and Dorsalis pulse intact bilaterally: Yes Comments      Fall Risk:    01/20/2024   10:29 AM  01/16/2024   10:14 AM 10/04/2023    8:48 AM 03/23/2023    9:02 AM 02/24/2023    1:44 PM  Fall Risk   Falls in the past year? 0 0 0 0 0  Number falls in past yr: 0 0 0  0  Injury with Fall? 0 0 0  0  Risk for fall due to :    No Fall Risks   Follow up Falls evaluation completed Falls evaluation completed Falls evaluation completed Falls prevention discussed      Functional Status Survey: Is the patient deaf or have difficulty hearing?: No Does the patient have difficulty seeing, even when wearing glasses/contacts?: No Does the patient have difficulty concentrating, remembering, or making decisions?: No Does the patient have difficulty walking or climbing stairs?: No Does the patient have difficulty dressing or bathing?: No Does the patient have difficulty doing errands alone such as visiting a doctor's office or shopping?: No   Assessment & Plan  Problem List Items Addressed This Visit   None Visit Diagnoses       Annual physical exam    -  Primary      Assessment and Plan Assessment & Plan Type 1 Diabetes Mellitus Type 1 Diabetes Mellitus managed with insulin . Currently using Freestyle Libre sensor, which is problematic due to frequent detachment and inaccurate readings. Prefers Dexcom sensor, which was more reliable and accurate, but insurance no longer covers it due to reduced insulin  usage. Experiences frequent false alarms of hypoglycemia with Freestyle Libre, causing sleep disturbances. Unable to calibrate Freestyle Libre sensor to improve accuracy. Plans to discuss potential appeal with endocrinologist to switch back to Dexcom. - Print instructions for customizing target glucose range in [facility] app. - Encourage discussion with endocrinologist regarding appeal for Dexcom coverage.  Hidradenitis Suppurativa Hidradenitis Suppurativa requires dermatology referral. [facility] and informed of a two-year waitlist for the primary dermatologist, but may see an  assistant sooner. Referral status is pending. - Follow up with referral coordinator to ensure referral to [facility] is processed.  Acute Allergic Skin Reaction to Facial Treatment Acute allergic skin reaction following a facial treatment at [facility], resulting in facial swelling and discomfort. The facility was unresponsive and unprofessional in addressing the issue. She seeks to cancel membership due to poor service and lack of follow-up. - Write a note stating she is not allowed to receive services from [facility] to facilitate membership cancellation.  Adult Wellness Visit Routine adult wellness visit. Blood pressure is within normal  range. Diet is well-balanced. Engages in regular physical activity including yoga, Pilates, and basketball. Sleep varies between six to eight hours per night. Recent dental and eye exams are up to date. No current sexual activity. No violence at home. Healthcare proxy is her sister. Patient requests to wait on labs till she sees her endocrinologist     -USPSTF grade A and B recommendations reviewed with patient; age-appropriate recommendations, preventive care, screening tests, etc discussed and encouraged; healthy living encouraged; see AVS for patient education given to patient -Discussed importance of 150 minutes of physical activity weekly, eat two servings of fish weekly, eat one serving of tree nuts ( cashews, pistachios, pecans, almonds.SABRA) every other day, eat 6 servings of fruit/vegetables daily and drink plenty of water and avoid sweet beverages.   -Reviewed Health Maintenance: yes

## 2024-01-20 NOTE — Telephone Encounter (Signed)
 Pt requesting status of referral to [facility]

## 2024-01-28 ENCOUNTER — Other Ambulatory Visit: Payer: Self-pay

## 2024-01-28 ENCOUNTER — Other Ambulatory Visit: Payer: Self-pay | Admitting: Nurse Practitioner

## 2024-01-28 DIAGNOSIS — E119 Type 2 diabetes mellitus without complications: Secondary | ICD-10-CM

## 2024-01-30 ENCOUNTER — Other Ambulatory Visit: Payer: Self-pay

## 2024-01-30 ENCOUNTER — Encounter: Payer: Self-pay | Admitting: Endocrinology

## 2024-01-30 ENCOUNTER — Other Ambulatory Visit (HOSPITAL_COMMUNITY): Payer: Self-pay

## 2024-01-30 ENCOUNTER — Other Ambulatory Visit: Payer: Self-pay | Admitting: Nurse Practitioner

## 2024-01-30 DIAGNOSIS — E119 Type 2 diabetes mellitus without complications: Secondary | ICD-10-CM

## 2024-01-30 MED ORDER — MOUNJARO 15 MG/0.5ML ~~LOC~~ SOAJ
15.0000 mg | SUBCUTANEOUS | 5 refills | Status: DC
  Filled 2024-01-30: qty 2, 28d supply, fill #0
  Filled 2024-02-29: qty 2, 28d supply, fill #1
  Filled 2024-03-24: qty 2, 28d supply, fill #2
  Filled 2024-04-23: qty 2, 28d supply, fill #3
  Filled 2024-05-19: qty 2, 28d supply, fill #4
  Filled 2024-06-14: qty 2, 28d supply, fill #5

## 2024-01-30 MED ORDER — BD PEN NEEDLE NANO 2ND GEN 32G X 4 MM MISC
1.0000 | Freq: Every day | 5 refills | Status: AC
  Filled 2024-02-29: qty 200, 34d supply, fill #0
  Filled 2024-04-01: qty 200, 34d supply, fill #1

## 2024-01-30 NOTE — Telephone Encounter (Signed)
 Requested medication (s) are due for refill today: yes  Requested medication (s) are on the active medication list: yes  Last refill:  08/16/23 2 ml 5 RF  Future visit scheduled: yes  Notes to clinic:  med not assigned to a protocol   Requested Prescriptions  Pending Prescriptions Disp Refills   tirzepatide  (MOUNJARO ) 15 MG/0.5ML Pen 2 mL 5    Sig: Inject 15 mg into the skin once a week.     Off-Protocol Failed - 01/30/2024  2:00 PM      Failed - Medication not assigned to a protocol, review manually.      Passed - Valid encounter within last 12 months    Recent Outpatient Visits           1 week ago Annual physical exam   [facility] Gareth Mliss FALCON, FNP   2 weeks ago Hidradenitis suppurativa   [facility] Gareth Mliss FALCON, FNP   3 months ago Screening examination for STD (sexually transmitted disease)   [facility] Gareth Mliss FALCON, FNP       Future Appointments             In 11 months Gareth, Mliss FALCON, FNP [facility]

## 2024-02-01 ENCOUNTER — Encounter: Payer: Self-pay | Admitting: Endocrinology

## 2024-02-01 ENCOUNTER — Ambulatory Visit: Admitting: Endocrinology

## 2024-02-01 ENCOUNTER — Other Ambulatory Visit (HOSPITAL_COMMUNITY): Payer: Self-pay

## 2024-02-01 ENCOUNTER — Encounter (HOSPITAL_COMMUNITY): Payer: Self-pay

## 2024-02-01 ENCOUNTER — Ambulatory Visit: Payer: Self-pay | Admitting: Endocrinology

## 2024-02-01 VITALS — BP 110/60 | HR 92 | Resp 16 | Ht 62.5 in | Wt 170.6 lb

## 2024-02-01 DIAGNOSIS — Z794 Long term (current) use of insulin: Secondary | ICD-10-CM

## 2024-02-01 DIAGNOSIS — E119 Type 2 diabetes mellitus without complications: Secondary | ICD-10-CM

## 2024-02-01 LAB — POCT GLYCOSYLATED HEMOGLOBIN (HGB A1C): Hemoglobin A1C: 6 % — AB (ref 4.0–5.6)

## 2024-02-01 MED ORDER — DEXCOM G7 SENSOR MISC
1.0000 | 3 refills | Status: AC
Start: 2024-02-01 — End: ?
  Filled 2024-02-01 (×2): qty 9, 90d supply, fill #0
  Filled 2024-02-02 – 2024-02-08 (×2): qty 3, 30d supply, fill #0
  Filled 2024-02-08 (×2): qty 9, 84d supply, fill #0
  Filled 2024-03-05: qty 3, 30d supply, fill #1
  Filled 2024-04-04: qty 3, 30d supply, fill #2
  Filled 2024-05-05: qty 3, 30d supply, fill #3
  Filled 2024-06-04: qty 3, 30d supply, fill #4
  Filled 2024-07-05: qty 3, 30d supply, fill #5

## 2024-02-01 MED ORDER — TRESIBA FLEXTOUCH 200 UNIT/ML ~~LOC~~ SOPN
45.0000 [IU] | PEN_INJECTOR | Freq: Every day | SUBCUTANEOUS | 3 refills | Status: AC
  Filled 2024-02-01 – 2024-03-19 (×4): qty 15, 65d supply, fill #0
  Filled 2024-06-04: qty 15, 65d supply, fill #1

## 2024-02-01 MED ORDER — NOVOLOG FLEXPEN 100 UNIT/ML ~~LOC~~ SOPN
PEN_INJECTOR | SUBCUTANEOUS | 4 refills | Status: AC
Start: 2024-02-01 — End: ?
  Filled 2024-02-01: qty 15, 42d supply, fill #0
  Filled 2024-03-24: qty 15, 42d supply, fill #1

## 2024-02-01 MED ORDER — INSULIN ASPART 100 UNIT/ML IJ SOLN
12.0000 [IU] | Freq: Three times a day (TID) | INTRAMUSCULAR | 99 refills | Status: DC
Start: 2024-02-01 — End: 2024-02-01
  Filled 2024-02-01: qty 10, 28d supply, fill #0

## 2024-02-01 NOTE — Patient Instructions (Signed)
 Sliding Scale before eating Blood Glucose        Insulin  60-150                     None 151-200                   3 units 201-250                   5 units 251-300                   7 units 301-350                   9 units 351-400                  11 units    >400                       12 units and call provider  Decrease tresiba  to 45 units daily.  Jardiance  same

## 2024-02-01 NOTE — Progress Notes (Signed)
 Outpatient Endocrinology Note Holly Vennie Waymire, MD  02/01/24  Patient's Name: Holly [name]    DOB: September 05, 2000    MRN: [id number]                                                    REASON OF VISIT: Follow-up for follow up of type 2 diabetes mellitus  REFERRING PROVIDER: Dorrene Nest, MD  PCP: Gareth Mliss FALCON, FNP  HISTORY OF PRESENT ILLNESS:   Holly [name] is a 23 y.o. old female with past medical history listed below, is here for follow up of type 2 diabetes mellitus.   Pertinent Diabetes History: Patient was diagnosed with type 2 diabetes mellitus, initially in June 2018, at the age of 16 years, regular blood work revealed hemoglobin A1c of 12%, at that time she was in [place]  for vacation, PCP asked to go to ER due to elevated hemoglobin A1c, she went to [facility] and had her initial evaluation, she had negative GAD 65, IA-2 and insulin  antibodies, records scanned into media and reviewed.  She was started on insulin  therapy after the diagnosis with Lantus  and NovoLog .  She was following with pediatric endocrinology, and established adult endocrinology care for the diabetes in August 2024.  History of DKA or diabetes related hospitalizations: none  Previous diabetes education: Yes   Family h/o diabetes mellitus: mother and aunt type 2 diabetes.   Chronic Diabetes Complications : Retinopathy: no. Last ophthalmology exam was done on annually.  Nephropathy: no Peripheral neuropathy: no Coronary artery disease: no Stroke: no  Relevant comorbidities and cardiovascular risk factors: Obesity: yes Body mass index is 30.71 kg/m.  Hypertension: no Hyperlipidemia. yes  Current / Home Diabetic regimen includes: Tresiba  60 units at bedtime.  Mounjaro  15 mg weekly.  Jardiance  25 mg daily.  Prior diabetic medications: Metformin  extended release , discontinued due to missing pills.  Denies side effects. Trulicity  in the past. Jardiance   tried, high cost. Had taken glipizide  in the past.   Glycemic data:    CONTINUOUS GLUCOSE MONITORING SYSTEM (CGMS) INTERPRETATION: At today's visit, we reviewed CGM downloads. The full report is scanned in the media. Reviewing the CGM trends, blood glucose are as follows:  Dexcom G7 CGM-  Sensor Download (Sensor download was reviewed and summarized below.) Dates: July 24 to February 01, 2024 for 14 days.  Glucose Management Indicator: n/a%     Interpretation: Not enough glucose data to accurately interpret she has used CGM for last 2 weeks 22% of time.  She mostly acceptable blood sugar.  She had occasional hypoglycemia in the early morning with blood sugar in the 60s.  She also has random mild hyperglycemia postprandially.  Hypoglycemia: Patient has minor hypoglycemic episodes. Patient has hypoglycemia awareness.  Factors modifying glucose control: 1.  Diabetic diet assessment: 3 meals a day, sometimes eats late around bedtime, chips.  2.  Staying active or exercising: hike / walking / 3 x a week. Gym.   3.  Medication compliance: compliant all of the time.  Interval history  She started going to the gym.  She lost about 10 pounds of weight in the last 6 months.  Diabetes regimen is reviewed and noted above.  She has decreased the dose of Tresiba  to 60 units.  CGM data as reviewed above however not enough data to accurately interpret.  She  has occasional mild hypoglycemia in the morning fasting.  Hemoglobin A1c today 6%.  She has complaints of adhesive with freestyle libre sensor has used extra tape especially with increased sweating with exercise and sometimes she has problem coming off of the sensor.  She is also concerned about accuracy of the blood sugar reading with the freestyle libre, she found sometimes the reading is quite different when she checked with glucometer.  She denies numbness and tingling the feet.  She has no vaginal yeast infection problem she has been taking  Jardiance .  No other complaints today.  REVIEW OF SYSTEMS As per history of present illness.   PAST MEDICAL HISTORY: Past Medical History:  Diagnosis Date   Diabetes mellitus without complication (HCC)    Mild acne 12/04/2013   Obesity, unspecified 12/04/2013    PAST SURGICAL HISTORY: Past Surgical History:  Procedure Laterality Date   MOUTH SURGERY      ALLERGIES: No Known Allergies  FAMILY HISTORY:  Family History  Problem Relation Age of Onset   Diabetes Mother     SOCIAL HISTORY: Social History   Socioeconomic History   Marital status: Single    Spouse name: Not on file   Number of children: Not on file   Years of education: Not on file   Highest education level: Not on file  Occupational History   Not on file  Tobacco Use   Smoking status: Never    Passive exposure: Never   Smokeless tobacco: Never  Vaping Use   Vaping status: Never Used  Substance and Sexual Activity   Alcohol use: Never   Drug use: Never   Sexual activity: Not Currently  Other Topics Concern   Not on file  Social History Narrative   No College. Not working right now   Social Drivers of Corporate investment banker Strain: Low Risk  (01/20/2024)   Overall Financial Resource Strain (CARDIA)    Difficulty of Paying Living Expenses: Not hard at all  Food Insecurity: No Food Insecurity (01/20/2024)   Hunger Vital Sign    Worried About Running Out of Food in the Last Year: Never true    Ran Out of Food in the Last Year: Never true  Transportation Needs: No Transportation Needs (01/20/2024)   PRAPARE - Administrator, Civil Service (Medical): No    Lack of Transportation (Non-Medical): No  Physical Activity: Insufficiently Active (01/20/2024)   Exercise Vital Sign    Days of Exercise per Week: 4 days    Minutes of Exercise per Session: 30 min  Stress: No Stress Concern Present (01/20/2024)   [employer] of Occupational Health - Occupational Stress Questionnaire     Feeling of Stress: Not at all  Social Connections: Moderately Integrated (01/07/2023)   Social Connection and Isolation Panel    Frequency of Communication with Friends and Family: More than three times a week    Frequency of Social Gatherings with Friends and Family: More than three times a week    Attends Religious Services: More than 4 times per year    Active Member of [employer] or Organizations: Yes    Attends Engineer, structural: More than 4 times per year    Marital Status: Never married    MEDICATIONS:  Current Outpatient Medications  Medication Sig Dispense Refill   Accu-Chek FastClix Lancets MISC Use to check blood sugar 3 times a day 300 each 5   Blood Glucose Monitoring Suppl (ACCU-CHEK GUIDE) w/Device KIT Use to  check sugars up to 3 times daily. 1 kit 0   Blood Glucose Monitoring Suppl (ACCU-CHEK GUIDE) w/Device KIT Use to check sugars up to 6 times daily. 1 kit 0   Clindamycin -Benzoyl Per, Refr, gel Apply 1 application  topically 2 (two) times daily. 45 g 1   Continuous Glucose Sensor (DEXCOM G7 SENSOR) MISC Use to continuously monitor blood glucose, changing the sensor every 10 days. 9 each 3   empagliflozin  (JARDIANCE ) 25 MG TABS tablet Take 1 tablet (25 mg total) by mouth daily before breakfast. 90 tablet 4   glucose blood (ACCU-CHEK GUIDE TEST) test strip Use to test blood glucose before meals and at bedtime 300 each 12   insulin  aspart (NOVOLOG  FLEXPEN) 100 UNIT/ML FlexPen Sliding Scale before eating Blood Glucose        Insulin  60-150                     None 151-200                   3 units 201-250                   5 units 251-300                   7 units 301-350                   9 units 351-400                  11 units    >400                       12 units and call provider 15 mL 4   Insulin  Pen Needle (BD PEN NEEDLE NANO 2ND GEN) 32G X 4 MM MISC USE TO INJECT INSULIN  6 TIMES DAILY 200 each 5   tirzepatide  (MOUNJARO ) 15 MG/0.5ML Pen Inject 15 mg into  the skin once a week. 2 mL 5   doxycycline  (VIBRAMYCIN ) 100 MG capsule Take 1 capsule (100 mg total) by mouth 2 (two) times daily. 20 capsule 0   insulin  degludec (TRESIBA  FLEXTOUCH) 200 UNIT/ML FlexTouch Pen Inject 46 Units into the skin daily. 15 mL 3   No current facility-administered medications for this visit.    PHYSICAL EXAM: Vitals:   02/01/24 0826  BP: 110/60  Pulse: 92  Resp: 16  SpO2: 99%  Weight: 170 lb 9.6 oz (77.4 kg)  Height: 5' 2.5 (1.588 m)    Body mass index is 30.71 kg/m.  Wt Readings from Last 3 Encounters:  02/01/24 170 lb 9.6 oz (77.4 kg)  01/20/24 165 lb 1.6 oz (74.9 kg)  01/16/24 167 lb 14.4 oz (76.2 kg)    General: Well developed, well nourished female in no apparent distress.  HEENT: AT/[place], no external lesions.  Eyes: Conjunctiva clear and no icterus. Neck: Neck supple  Lungs: Respirations not labored Neurologic: Alert, oriented, normal speech Extremities / Skin: Dry. .  Psychiatric: Does not appear depressed or anxious  Diabetic Foot Exam - Simple   No data filed     LABS Reviewed Lab Results  Component Value Date   HGBA1C 6.0 (A) 02/01/2024   HGBA1C 7.2 (A) 08/15/2023   HGBA1C 7.0 (A) 05/17/2023   No results found for: FRUCTOSAMINE Lab Results  Component Value Date   CHOL 145 11/17/2022   HDL 43 (L) 11/17/2022   LDLCALC 85 11/17/2022   TRIG 82  11/17/2022   CHOLHDL 3.4 11/17/2022   Lab Results  Component Value Date   MICRALBCREAT 4 11/17/2022   Lab Results  Component Value Date   CREATININE 0.48 02/24/2023   Lab Results  Component Value Date   GFR 135.52 02/24/2023    ASSESSMENT / PLAN  1. Type 2 diabetes mellitus treated with insulin  (HCC)   2. Type 2 diabetes mellitus without complication, with long-term current use of insulin  (HCC)   3. Controlled type 2 diabetes mellitus without complication, with long-term current use of insulin  (HCC)    Diabetes Mellitus type 2, complicated by no known complications -  Diabetic status / severity: controlled.   Lab Results  Component Value Date   HGBA1C 6.0 (A) 02/01/2024    - Hemoglobin A1c goal : <6.5%  She has occasional fasting hypoglycemia and mild hyperglycemia.  Adjusted insulin  regimen as follows with decrease basal insulin  and add mealtime insulin  sliding scale for now.  - Medications: See below.  I) decreased Tresiba  from 60 to 45 units daily.   II) continue Mounjaro  15 mg weekly. III) continue Jardiance  25 mg daily. IV) start NovoLog  sliding scale as follows with meals 3 times a day.  Sliding Scale before eating Blood Glucose        Insulin  60-150                     None 151-200                   3 units 201-250                   5 units 251-300                   7 units 301-350                   9 units 351-400                  11 units    >400                       12 units and call provider  - Home glucose testing: Sent prescription for Dexcom G7 CGM.  She has received problem with freestyle libre 3 sensor.  Check blood sugar as needed.  - Discussed/ Gave Hypoglycemia treatment plan.  # Consult : not required at this time.   # Annual urine for microalbuminuria/ creatinine ratio, no microalbuminuria currently.  Will check today. Last  Lab Results  Component Value Date   MICRALBCREAT 4 11/17/2022    # Foot check nightly.  # Annual dilated diabetic eye exams.   - Diet: Make healthy diabetic food choices.  Discussed in detail mainly asked to avoid bedtime snacks especially chips. - Life style / activity / exercise: Discussed.  2. Blood pressure  -  BP Readings from Last 1 Encounters:  02/01/24 110/60    - Control is in target.  - No change in current plans.  3. Lipid status / Hyperlipidemia - Last  Lab Results  Component Value Date   LDLCALC 85 11/17/2022   -Statin is not indicated at this time. - Will check lipid panel today.  Diagnoses and all orders for this visit:  Type 2 diabetes mellitus treated  with insulin  (HCC) -     POCT glycosylated hemoglobin (Hb A1C) -     Continuous Glucose Sensor (DEXCOM G7 SENSOR) MISC; Use to  continuously monitor blood glucose, changing the sensor every 10 days. -     Discontinue: insulin  aspart (NOVOLOG ) 100 UNIT/ML injection; Use Sliding Scale before eating: Blood Glucose        Insulin  60-150                     None 151-200                   3 units 201-250                   5 units 251-300                   7 units 301-350                   9 units 351-400                  11 units    >400                       12 units and call provider -     Lipid panel -     Microalbumin / creatinine urine ratio -     Basic metabolic panel with GFR -     insulin  aspart (NOVOLOG  FLEXPEN) 100 UNIT/ML FlexPen; Sliding Scale before eating Blood Glucose        Insulin  60-150                     None 151-200                   3 units 201-250                   5 units 251-300                   7 units 301-350                   9 units 351-400                  11 units    >400                       12 units and call provider  Type 2 diabetes mellitus without complication, with long-term current use of insulin  (HCC) -     insulin  degludec (TRESIBA  FLEXTOUCH) 200 UNIT/ML FlexTouch Pen; Inject 46 Units into the skin daily.  Controlled type 2 diabetes mellitus without complication, with long-term current use of insulin  (HCC)   DISPOSITION Follow up in clinic in 4 months suggested.  Labs as ordered today.   All questions answered and patient verbalized understanding of the plan.  Holly Katha Kuehne, MD [facility] Endocrinology [facility] [address] [place], [address] [place], [place] [address] Phone # [phone]  At least part of this note was generated using voice recognition software. Inadvertent word errors may have occurred, which were not recognized during the proofreading process.

## 2024-02-02 ENCOUNTER — Telehealth: Payer: Self-pay | Admitting: Pharmacy Technician

## 2024-02-02 ENCOUNTER — Other Ambulatory Visit (HOSPITAL_COMMUNITY): Payer: Self-pay

## 2024-02-02 ENCOUNTER — Other Ambulatory Visit: Payer: Self-pay

## 2024-02-02 NOTE — Telephone Encounter (Signed)
 Pharmacy Patient Advocate Encounter  Received notification from [facility] that Prior Authorization for Dexcom G7 Sensor has been APPROVED from 02/02/24 to 07/31/24   PA #/Case ID/Reference #: [id number]

## 2024-02-02 NOTE — Telephone Encounter (Signed)
 Pharmacy Patient Advocate Encounter   Received notification from CoverMyMeds that prior authorization for Dexcom G7 Sensor is required/requested.   Insurance verification completed.   The patient is insured through [facility] .   Per test claim: PA required; PA submitted to above mentioned insurance via CoverMyMeds Key/confirmation #/EOC [id number] Status is pending

## 2024-02-03 LAB — LIPID PANEL
Cholesterol: 154 mg/dL (ref <200)
HDL: 46 mg/dL — ABNORMAL LOW (ref >=50)
LDL Cholesterol (Calc): 85 mg/dL
Non-HDL Cholesterol (Calc): 108 mg/dL (ref <130)
Total CHOL/HDL Ratio: 3.3 (calc) (ref <5.0)
Triglycerides: 135 mg/dL (ref <150)

## 2024-02-03 LAB — MICROALBUMIN / CREATININE URINE RATIO
Creatinine, Urine: 90 mg/dL (ref 20–275)
Microalb, Ur: <0.2 mg/dL

## 2024-02-03 LAB — BASIC METABOLIC PANEL WITH GFR
BUN: 12 mg/dL (ref 7–25)
CO2: 22 mmol/L (ref 20–32)
Calcium: 9.8 mg/dL (ref 8.6–10.2)
Chloride: 104 mmol/L (ref 98–110)
Creat: 0.54 mg/dL (ref 0.50–0.96)
Glucose, Bld: 106 mg/dL — ABNORMAL HIGH (ref 65–99)
Potassium: 4.2 mmol/L (ref 3.5–5.3)
Sodium: 140 mmol/L (ref 135–146)
eGFR: 133 mL/min/1.73m2 (ref >=60)

## 2024-02-07 ENCOUNTER — Encounter: Payer: Self-pay | Admitting: Endocrinology

## 2024-02-07 ENCOUNTER — Other Ambulatory Visit (HOSPITAL_COMMUNITY): Payer: Self-pay

## 2024-02-08 ENCOUNTER — Other Ambulatory Visit (HOSPITAL_COMMUNITY): Payer: Self-pay

## 2024-02-09 ENCOUNTER — Other Ambulatory Visit (HOSPITAL_COMMUNITY): Payer: Self-pay

## 2024-02-29 ENCOUNTER — Other Ambulatory Visit (HOSPITAL_COMMUNITY): Payer: Self-pay

## 2024-02-29 ENCOUNTER — Other Ambulatory Visit: Payer: Self-pay

## 2024-03-01 ENCOUNTER — Other Ambulatory Visit (HOSPITAL_COMMUNITY): Payer: Self-pay

## 2024-03-01 MED ORDER — CLINDAMYCIN PHOSPHATE 1 % EX SWAB
1.0000 | Freq: Two times a day (BID) | CUTANEOUS | 11 refills | Status: AC
  Filled 2024-03-01 (×2): qty 60, 30d supply, fill #0
  Filled 2024-06-04: qty 60, 30d supply, fill #1

## 2024-03-05 ENCOUNTER — Other Ambulatory Visit (HOSPITAL_COMMUNITY): Payer: Self-pay

## 2024-03-05 ENCOUNTER — Other Ambulatory Visit: Payer: Self-pay

## 2024-03-20 ENCOUNTER — Other Ambulatory Visit (HOSPITAL_COMMUNITY): Payer: Self-pay

## 2024-03-22 ENCOUNTER — Ambulatory Visit: Admitting: Endocrinology

## 2024-03-26 ENCOUNTER — Other Ambulatory Visit (HOSPITAL_COMMUNITY): Payer: Self-pay

## 2024-04-11 ENCOUNTER — Telehealth: Payer: Self-pay | Admitting: Pharmacy Technician

## 2024-04-11 NOTE — Telephone Encounter (Signed)
 Pharmacy Patient Advocate Encounter   Received notification from CoverMyMeds that prior authorization for FreeStyle Libre 3 Plus Sensor is due for renewal.   Insurance verification completed.   The patient is insured through [employer].  Action: Medication has been discontinued. Archived Key: [id number]  **Patient is using Dexcom now.**

## 2024-04-18 ENCOUNTER — Encounter: Payer: Self-pay | Admitting: Internal Medicine

## 2024-04-18 LAB — OPHTHALMOLOGY REPORT-SCANNED

## 2024-06-04 ENCOUNTER — Ambulatory Visit: Admitting: Endocrinology

## 2024-06-07 ENCOUNTER — Encounter: Payer: Self-pay | Admitting: Endocrinology

## 2024-06-07 ENCOUNTER — Ambulatory Visit: Payer: Self-pay | Admitting: Endocrinology

## 2024-06-07 ENCOUNTER — Other Ambulatory Visit

## 2024-06-07 ENCOUNTER — Ambulatory Visit: Admitting: Endocrinology

## 2024-06-07 VITALS — BP 112/76 | HR 92 | Resp 16 | Ht 62.5 in | Wt 166.2 lb

## 2024-06-07 DIAGNOSIS — E119 Type 2 diabetes mellitus without complications: Secondary | ICD-10-CM

## 2024-06-07 DIAGNOSIS — Z794 Long term (current) use of insulin: Secondary | ICD-10-CM | POA: Diagnosis not present

## 2024-06-07 DIAGNOSIS — L680 Hirsutism: Secondary | ICD-10-CM | POA: Diagnosis not present

## 2024-06-07 DIAGNOSIS — R7989 Other specified abnormal findings of blood chemistry: Secondary | ICD-10-CM

## 2024-06-07 LAB — POCT GLYCOSYLATED HEMOGLOBIN (HGB A1C): Hemoglobin A1C: 6.1 % — AB (ref 4.0–5.6)

## 2024-06-07 NOTE — Progress Notes (Signed)
 Outpatient Endocrinology Note Holly Pozo, MD  06/07/2024  Patient's Name: Holly [name]    DOB: April 15, 2001    MRN: [id number]                                                    REASON OF VISIT: Follow-up for follow up of type 2 diabetes mellitus  REFERRING PROVIDER: Dorrene Nest, MD  PCP: Gareth Mliss FALCON, FNP  HISTORY OF PRESENT ILLNESS:   Holly [name] is a 23 y.o. old female with past medical history listed below, is here for follow up of type 2 diabetes mellitus.  She has new complaints of facial hair.  Pertinent Diabetes History: Patient was diagnosed with type 2 diabetes mellitus, initially in June 2018, at the age of 16 years, regular blood work revealed hemoglobin A1c of 12%, at that time she was in [place]  for vacation, PCP asked to go to ER due to elevated hemoglobin A1c, she went to [facility] and had her initial evaluation, she had negative GAD 65, IA-2 and insulin  antibodies, records scanned into media and reviewed.  She was started on insulin  therapy after the diagnosis with Lantus  and NovoLog .  She was following with pediatric endocrinology, and established adult endocrinology care for the diabetes in August 2024.  History of DKA or diabetes related hospitalizations: none  Previous diabetes education: Yes   Family h/o diabetes mellitus: mother and aunt type 2 diabetes.   Chronic Diabetes Complications : Retinopathy: no. Last ophthalmology exam was done on annually.  Nephropathy: no Peripheral neuropathy: no Coronary artery disease: no Stroke: no  Relevant comorbidities and cardiovascular risk factors: Obesity: yes Body mass index is 29.91 kg/m.  Hypertension: no Hyperlipidemia. yes  Current / Home Diabetic regimen includes: Tresiba  44 units at bedtime.  Mounjaro  15 mg weekly.  Jardiance  25 mg daily. NovoLog  sliding scale.  Has not been requiring lately.  Prior diabetic medications: Metformin  extended release  , discontinued due to missing pills.  Denies side effects. Trulicity  in the past. Jardiance  tried, high cost. Had taken glipizide  in the past.  Glycemic data:    CONTINUOUS GLUCOSE MONITORING SYSTEM (CGMS) INTERPRETATION: At today's visit, we reviewed CGM downloads. The full report is scanned in the media. Reviewing the CGM trends, blood glucose are as follows:  Dexcom G7 CGM-  Sensor Download (Sensor download was reviewed and summarized below.) Dates: November 28 to June 07, 2024 for 14 days.  Glucose Management Indicator: 6.7%     Interpretation: Mostly acceptable blood sugar.  Occasional mild hyperglycemia with blood sugars in the low 200 range postprandially especially around bedtime related to bedtime snack.  Blood sugar overnight and other times of the day are acceptable.  No hypoglycemia.  Hypoglycemia: Patient has no hypoglycemic episodes. Patient has hypoglycemia awareness.  Factors modifying glucose control: 1.  Diabetic diet assessment: 3 meals a day, sometimes eats late around bedtime, chips.  2.  Staying active or exercising: hike / walking / 3 x a week. Gym.   3.  Medication compliance: compliant all of the time.  Interval history  Hemoglobin A1c 6.1%.  Diabetes regimen is reviewed and noted above.  CGM data as reviewed above mostly acceptable blood sugar.  Body weight is relatively stable.  No numbness and tingling of the feet.  No vision problem.  No other complaints today.  She  has new complaints of facial hair, requiring frequent waxing.  She also has noticed increased acne is in the face.  She had seen dermatology in the past few years ago, recommended for spironolactone, not started.  She reports fairly regular menstrual cycle.  She has not been on birth control method.  She has no plan for pregnancy in the near future.  No other complaints today.  REVIEW OF SYSTEMS As per history of present illness.   PAST MEDICAL HISTORY: Past Medical History:   Diagnosis Date   Diabetes mellitus without complication (HCC)    Mild acne 12/04/2013   Obesity, unspecified 12/04/2013    PAST SURGICAL HISTORY: Past Surgical History:  Procedure Laterality Date   MOUTH SURGERY      ALLERGIES: No Known Allergies  FAMILY HISTORY:  Family History  Problem Relation Age of Onset   Diabetes Mother     SOCIAL HISTORY: Social History   Socioeconomic History   Marital status: Single    Spouse name: Not on file   Number of children: Not on file   Years of education: Not on file   Highest education level: Not on file  Occupational History   Not on file  Tobacco Use   Smoking status: Never    Passive exposure: Never   Smokeless tobacco: Never  Vaping Use   Vaping status: Never Used  Substance and Sexual Activity   Alcohol use: Never   Drug use: Never   Sexual activity: Not Currently  Other Topics Concern   Not on file  Social History Narrative   No College. Not working right now   Social Drivers of Health   Tobacco Use: Low Risk (06/07/2024)   Patient History    Smoking Tobacco Use: Never    Smokeless Tobacco Use: Never    Passive Exposure: Never  Financial Resource Strain: Low Risk (01/20/2024)   Overall Financial Resource Strain (CARDIA)    Difficulty of Paying Living Expenses: Not hard at all  Food Insecurity: No Food Insecurity (01/20/2024)   Epic    Worried About Programme Researcher, Broadcasting/film/video in the Last Year: Never true    Ran Out of Food in the Last Year: Never true  Transportation Needs: No Transportation Needs (01/20/2024)   Epic    Lack of Transportation (Medical): No    Lack of Transportation (Non-Medical): No  Physical Activity: Insufficiently Active (01/20/2024)   Exercise Vital Sign    Days of Exercise per Week: 4 days    Minutes of Exercise per Session: 30 min  Stress: No Stress Concern Present (01/20/2024)   [employer] [name] - Occupational Stress Questionnaire    Feeling of Stress: Not at all   Social Connections: Moderately Integrated (01/07/2023)   Social Connection and Isolation Panel    Frequency of Communication with Friends and Family: More than three times a week    Frequency of Social Gatherings with Friends and Family: More than three times a week    Attends Religious Services: More than 4 times per year    Active Member of Clubs or Organizations: Yes    Attends Banker Meetings: More than 4 times per year    Marital Status: Never married  Depression (PHQ2-9): Low Risk (01/16/2024)   Depression (PHQ2-9)    PHQ-2 Score: 0  Alcohol Screen: Low Risk (01/20/2024)   Alcohol Screen    Last Alcohol Screening Score (AUDIT): 1  Housing: Unknown (01/20/2024)   Epic    Unable to Pay  for Housing in the Last Year: No    Number of Times Moved in the Last Year: Not on file    Homeless in the Last Year: No  Utilities: Not At Risk (01/20/2024)   Epic    Threatened with loss of utilities: No  Health Literacy: Adequate Health Literacy (01/20/2024)   B1300 Health Literacy    Frequency of need for help with medical instructions: Never    MEDICATIONS:  Current Outpatient Medications  Medication Sig Dispense Refill   Accu-Chek FastClix Lancets MISC Use to check blood sugar 3 times a day 300 each 5   Blood Glucose Monitoring Suppl (ACCU-CHEK GUIDE) w/Device KIT Use to check sugars up to 3 times daily. 1 kit 0   clindamycin  (CLEOCIN  T) 1 % SWAB Apply 1 application  topically 2 (two) times daily. 60 each 11   Continuous Glucose Sensor (DEXCOM G7 SENSOR) MISC Use to continuously monitor blood glucose, changing the sensor every 10 days. 9 each 3   empagliflozin  (JARDIANCE ) 25 MG TABS tablet Take 1 tablet (25 mg total) by mouth daily before breakfast. 90 tablet 4   glucose blood (ACCU-CHEK GUIDE TEST) test strip Use to test blood glucose before meals and at bedtime 300 each 12   insulin  aspart (NOVOLOG  FLEXPEN) 100 UNIT/ML FlexPen Sliding Scale before eating Blood Glucose         Insulin  60-150                     None 151-200                   3 units 201-250                   5 units 251-300                   7 units 301-350                   9 units 351-400                  11 units    >400                       12 units and call provider 15 mL 4   insulin  degludec (TRESIBA  FLEXTOUCH) 200 UNIT/ML FlexTouch Pen Inject 46 Units into the skin daily. 15 mL 3   Insulin  Pen Needle (BD PEN NEEDLE NANO 2ND GEN) 32G X 4 MM MISC USE TO INJECT INSULIN  6 TIMES DAILY 200 each 5   tirzepatide  (MOUNJARO ) 15 MG/0.5ML Pen Inject 15 mg into the skin once a week. 2 mL 5   No current facility-administered medications for this visit.    PHYSICAL EXAM: Vitals:   06/07/24 0855  BP: 112/76  Pulse: 92  Resp: 16  SpO2: 98%  Weight: 166 lb 3.2 oz (75.4 kg)  Height: 5' 2.5 (1.588 m)     Body mass index is 29.91 kg/m.  Wt Readings from Last 3 Encounters:  06/07/24 166 lb 3.2 oz (75.4 kg)  02/01/24 170 lb 9.6 oz (77.4 kg)  01/20/24 165 lb 1.6 oz (74.9 kg)    General: Well developed, well nourished female in no apparent distress.  HEENT: AT/[place], no external lesions.  Eyes: Conjunctiva clear and no icterus. Neck: Neck supple  Lungs: Respirations not labored Neurologic: Alert, oriented, normal speech Extremities / Skin: Dry. .  Psychiatric: Does not appear depressed or anxious  Diabetic Foot Exam - Simple   No data filed     LABS Reviewed Lab Results  Component Value Date   HGBA1C 6.1 (A) 06/07/2024   HGBA1C 6.0 (A) 02/01/2024   HGBA1C 7.2 (A) 08/15/2023   No results found for: FRUCTOSAMINE Lab Results  Component Value Date   CHOL 154 02/01/2024   HDL 46 (L) 02/01/2024   LDLCALC 85 02/01/2024   TRIG 135 02/01/2024   CHOLHDL 3.3 02/01/2024   Lab Results  Component Value Date   MICRALBCREAT NOTE 02/01/2024   MICRALBCREAT 4 11/17/2022   Lab Results  Component Value Date   CREATININE 0.54 02/01/2024   Lab Results  Component Value Date   GFR 135.52  02/24/2023    ASSESSMENT / PLAN  1. 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   Lab Results  Component Value Date   HGBA1C 6.1 (A) 06/07/2024    - Hemoglobin A1c goal : <6.5%  Discussed that when she plan for pregnancy or become pregnant Mounjaro  needs to be stopped.  - Medications: See below.  I) continue Tresiba  44 units daily. II) continue Mounjaro  15 mg weekly. III) continue Jardiance  25 mg daily. IV) continue NovoLog  sliding scale as follows with meals 3 times a day.  Sliding Scale before eating Blood Glucose        Insulin  60-150                     None 151-200                   3 units 201-250                   5 units 251-300                   7 units 301-350                   9 units 351-400                  11 units    >400                       12 units and call provider  V) consider metformin  in the future.  - Home glucose testing: Continue CGM /Dexcom G7 CGM.  She has received problem with freestyle libre 3 sensor.  Check blood sugar as needed.  - Discussed/ Gave Hypoglycemia treatment plan.  # Consult : not required at this time.   # Annual urine for microalbuminuria/ creatinine ratio, no microalbuminuria currently.   Last  Lab Results  Component Value Date   MICRALBCREAT NOTE 02/01/2024    # Foot check nightly.  # Annual dilated diabetic eye exams.   - Diet: Make healthy diabetic food choices.  Discussed in detail mainly asked to avoid bedtime snacks especially chips. - Life style / activity / exercise: Discussed.  2. Blood pressure  -  BP Readings from Last 1 Encounters:  06/07/24 112/76    - Control is in target.  - No change in current plans.  3. Lipid status / Hyperlipidemia - Last  Lab Results  Component Value Date   LDLCALC 85 02/01/2024   -Statin is not indicated at this time.  #  Hirsutism : - Will check total  and free testosterone and cortisol.  She had eaten a good this morning. - Discussed that if she is positive for PCOS with elevated testosterone advised to follow-up with OB/GYN.  Diagnoses and all orders for this visit:  Type 2 diabetes mellitus treated with insulin  (HCC) -     POCT glycosylated hemoglobin (Hb A1C)  Female hirsutism -     Cortisol -     Testosterone , Free and Total    DISPOSITION Follow up in clinic in 4 - 5 months suggested.  Labs today as ordered today.   All questions answered and patient verbalized understanding of the plan.  Karmella Bouvier, MD [facility] [address] [place], [address] [place], [place] [address] Phone # [phone]  At least part of this note was generated using voice recognition software. Inadvertent word errors may have occurred, which were not recognized during the proofreading process.

## 2024-06-09 LAB — CORTISOL: Cortisol, Plasma: 24.2 ug/dL — ABNORMAL HIGH

## 2024-06-15 ENCOUNTER — Encounter: Payer: Self-pay | Admitting: Endocrinology

## 2024-06-15 ENCOUNTER — Other Ambulatory Visit (HOSPITAL_COMMUNITY): Payer: Self-pay

## 2024-06-17 LAB — TESTOSTERONE, FREE & TOTAL
Free Testosterone: 3.7 pg/mL (ref 0.1–6.4)
Testosterone, Total, LC-MS-MS: 21 ng/dL (ref 2–45)

## 2024-06-22 ENCOUNTER — Other Ambulatory Visit

## 2024-07-05 ENCOUNTER — Other Ambulatory Visit (HOSPITAL_COMMUNITY): Payer: Self-pay

## 2024-07-05 ENCOUNTER — Other Ambulatory Visit: Payer: Self-pay

## 2024-07-05 ENCOUNTER — Other Ambulatory Visit: Payer: Self-pay | Admitting: Endocrinology

## 2024-07-05 DIAGNOSIS — E119 Type 2 diabetes mellitus without complications: Secondary | ICD-10-CM

## 2024-07-05 MED ORDER — MOUNJARO 15 MG/0.5ML ~~LOC~~ SOAJ
15.0000 mg | SUBCUTANEOUS | 3 refills | Status: AC
  Filled 2024-07-05: qty 6, 84d supply, fill #0

## 2024-07-06 ENCOUNTER — Telehealth: Payer: Self-pay | Admitting: Pharmacy Technician

## 2024-07-06 ENCOUNTER — Other Ambulatory Visit (HOSPITAL_COMMUNITY): Payer: Self-pay

## 2024-07-06 NOTE — Telephone Encounter (Signed)
 Pharmacy Patient Advocate Encounter   Received notification from [facility] KEY that prior authorization for Dexcom G7 Sensor  is required/requested.   Insurance verification completed.   The patient is insured through [employer].   Per test claim: PA required; PA submitted to above mentioned insurance via Latent Key/confirmation #/EOC [id number] Status is pending

## 2024-07-06 NOTE — Telephone Encounter (Signed)
 error

## 2024-07-09 ENCOUNTER — Other Ambulatory Visit (HOSPITAL_COMMUNITY): Payer: Self-pay

## 2024-07-09 NOTE — Telephone Encounter (Signed)
 Pharmacy Patient Advocate Encounter  Received notification from [employer] that Prior Authorization for Dexcom G7 Sensor  has been APPROVED from 07/06/24 to 07/06/25. Unable to obtain price due to refill too soon rejection, last fill date 07/05/24 next available fill date01/31/26   PA #/Case ID/Reference #: [id number]

## 2024-07-19 ENCOUNTER — Other Ambulatory Visit

## 2024-07-19 ENCOUNTER — Other Ambulatory Visit: Payer: Self-pay

## 2024-07-21 LAB — CORTISOL: Cortisol, Plasma: 17.2 ug/dL

## 2024-07-23 ENCOUNTER — Other Ambulatory Visit

## 2024-11-07 ENCOUNTER — Ambulatory Visit: Admitting: Endocrinology

## 2025-01-22 ENCOUNTER — Encounter: Admitting: Nurse Practitioner
